# Patient Record
Sex: Female | Born: 1990 | Race: White | Hispanic: No | Marital: Single | Smoking: Never smoker
Health system: Southern US, Community
[De-identification: ages and names within clinical notes are randomized; demographics above are authoritative.]

## PROBLEM LIST (undated history)

## (undated) DIAGNOSIS — F32A Depression, unspecified: Secondary | ICD-10-CM

## (undated) DIAGNOSIS — F329 Major depressive disorder, single episode, unspecified: Secondary | ICD-10-CM

## (undated) DIAGNOSIS — F419 Anxiety disorder, unspecified: Secondary | ICD-10-CM

## (undated) DIAGNOSIS — F429 Obsessive-compulsive disorder, unspecified: Secondary | ICD-10-CM

---

## 2015-07-12 ENCOUNTER — Observation Stay (HOSPITAL_COMMUNITY)
Admission: EM | Admit: 2015-07-12 | Discharge: 2015-07-15 | Disposition: A | Discharge status: Home / Self Care | Payer: Self-pay | Attending: Internal Medicine | Admitting: Internal Medicine

## 2015-07-12 ENCOUNTER — Emergency Department (HOSPITAL_COMMUNITY): Payer: Self-pay

## 2015-07-12 ENCOUNTER — Encounter (HOSPITAL_COMMUNITY): Payer: Self-pay

## 2015-07-12 DIAGNOSIS — R740 Nonspecific elevation of levels of transaminase and lactic acid dehydrogenase [LDH]: Secondary | ICD-10-CM

## 2015-07-12 DIAGNOSIS — F419 Anxiety disorder, unspecified: Secondary | ICD-10-CM | POA: Insufficient documentation

## 2015-07-12 DIAGNOSIS — K819 Cholecystitis, unspecified: Secondary | ICD-10-CM | POA: Diagnosis present

## 2015-07-12 DIAGNOSIS — Z79899 Other long term (current) drug therapy: Secondary | ICD-10-CM | POA: Insufficient documentation

## 2015-07-12 DIAGNOSIS — E669 Obesity, unspecified: Secondary | ICD-10-CM | POA: Insufficient documentation

## 2015-07-12 DIAGNOSIS — K831 Obstruction of bile duct: Secondary | ICD-10-CM | POA: Diagnosis present

## 2015-07-12 DIAGNOSIS — K8010 Calculus of gallbladder with chronic cholecystitis without obstruction: Principal | ICD-10-CM | POA: Diagnosis present

## 2015-07-12 DIAGNOSIS — Z419 Encounter for procedure for purposes other than remedying health state, unspecified: Secondary | ICD-10-CM

## 2015-07-12 DIAGNOSIS — K8050 Calculus of bile duct without cholangitis or cholecystitis without obstruction: Secondary | ICD-10-CM | POA: Diagnosis present

## 2015-07-12 DIAGNOSIS — K8020 Calculus of gallbladder without cholecystitis without obstruction: Secondary | ICD-10-CM

## 2015-07-12 DIAGNOSIS — R7401 Elevation of levels of liver transaminase levels: Secondary | ICD-10-CM

## 2015-07-12 DIAGNOSIS — Z6832 Body mass index (BMI) 32.0-32.9, adult: Secondary | ICD-10-CM | POA: Insufficient documentation

## 2015-07-12 HISTORY — DX: Anxiety disorder, unspecified: F41.9

## 2015-07-12 HISTORY — DX: Depression, unspecified: F32.A

## 2015-07-12 HISTORY — DX: Major depressive disorder, single episode, unspecified: F32.9

## 2015-07-12 HISTORY — DX: Obsessive-compulsive disorder, unspecified: F42.9

## 2015-07-12 LAB — URINALYSIS, ROUTINE W REFLEX MICROSCOPIC
Glucose, UA: NEGATIVE mg/dL
Hgb urine dipstick: NEGATIVE
NITRITE: NEGATIVE
PH: 5.5 (ref 5.0–8.0)
PROTEIN: NEGATIVE mg/dL
Specific Gravity, Urine: 1.012 (ref 1.005–1.030)
Urobilinogen, UA: 0.2 mg/dL (ref 0.0–1.0)

## 2015-07-12 LAB — COMPREHENSIVE METABOLIC PANEL
ALT: 273 U/L — ABNORMAL HIGH (ref 14–54)
AST: 158 U/L — ABNORMAL HIGH (ref 15–41)
Albumin: 3.2 g/dL — ABNORMAL LOW (ref 3.5–5.0)
Alkaline Phosphatase: 292 U/L — ABNORMAL HIGH (ref 38–126)
Anion gap: 11 (ref 5–15)
BUN: <5 mg/dL — ABNORMAL LOW (ref 6–20)
CHLORIDE: 111 mmol/L (ref 101–111)
CO2: 12 mmol/L — ABNORMAL LOW (ref 22–32)
Calcium: 8.1 mg/dL — ABNORMAL LOW (ref 8.9–10.3)
Glucose, Bld: 74 mg/dL (ref 65–99)
POTASSIUM: 4.3 mmol/L (ref 3.5–5.1)
Sodium: 134 mmol/L — ABNORMAL LOW (ref 135–145)
Total Bilirubin: 5.3 mg/dL — ABNORMAL HIGH (ref 0.3–1.2)
Total Protein: 5.9 g/dL — ABNORMAL LOW (ref 6.5–8.1)

## 2015-07-12 LAB — LIPASE, BLOOD: LIPASE: 19 U/L — AB (ref 22–51)

## 2015-07-12 LAB — CBC
HCT: 35.3 % — ABNORMAL LOW (ref 36.0–46.0)
Hemoglobin: 11.8 g/dL — ABNORMAL LOW (ref 12.0–15.0)
MCH: 30.3 pg (ref 26.0–34.0)
MCHC: 33.4 g/dL (ref 30.0–36.0)
MCV: 90.5 fL (ref 78.0–100.0)
PLATELETS: 307 10*3/uL (ref 150–400)
RBC: 3.9 MIL/uL (ref 3.87–5.11)
RDW: 13.2 % (ref 11.5–15.5)
WBC: 10 10*3/uL (ref 4.0–10.5)

## 2015-07-12 LAB — I-STAT BETA HCG BLOOD, ED (MC, WL, AP ONLY): I-stat hCG, quantitative: <5 m[IU]/mL (ref <5)

## 2015-07-12 LAB — URINE MICROSCOPIC-ADD ON

## 2015-07-12 MED ORDER — SODIUM CHLORIDE 0.9 % IV BOLUS (SEPSIS)
1000.0000 mL | Freq: Once | INTRAVENOUS | Status: AC
  Administered 2015-07-12: 1000 mL via INTRAVENOUS

## 2015-07-12 MED ORDER — ONDANSETRON HCL 4 MG/2ML IJ SOLN
4.0000 mg | Freq: Once | INTRAMUSCULAR | Status: AC
  Administered 2015-07-12: 4 mg via INTRAVENOUS
  Filled 2015-07-12: qty 2

## 2015-07-12 MED ORDER — ONDANSETRON HCL 4 MG/2ML IJ SOLN
4.0000 mg | Freq: Once | INTRAMUSCULAR | Status: AC
Start: 2015-07-12 — End: 2015-07-12
  Administered 2015-07-12: 4 mg via INTRAVENOUS
  Filled 2015-07-12: qty 2

## 2015-07-12 MED ORDER — KETOROLAC TROMETHAMINE 30 MG/ML IJ SOLN
30.0000 mg | Freq: Once | INTRAMUSCULAR | Status: AC
Start: 2015-07-12 — End: 2015-07-12
  Administered 2015-07-12: 30 mg via INTRAVENOUS
  Filled 2015-07-12: qty 1

## 2015-07-12 MED ORDER — MORPHINE SULFATE (PF) 4 MG/ML IV SOLN
4.0000 mg | Freq: Once | INTRAVENOUS | Status: AC
  Administered 2015-07-12: 4 mg via INTRAVENOUS
  Filled 2015-07-12: qty 1

## 2015-07-12 NOTE — ED Notes (Signed)
I ATTEMPTED TO COLLECT LABS TWICE AND WAS UNSUCCESSFUL.  I MADE THE NURSE AWARE. 

## 2015-07-12 NOTE — ED Notes (Signed)
Ultrasound Tech at bedside.

## 2015-07-12 NOTE — ED Notes (Signed)
Pt c/o bilateral ribcage pain, generalized back pain, and insomnia x 4 days and emesis x 2 days.  Pain score 8/10., increasing w/ deep breathing.  Sts urine had been "very dark yellow."  Denies diarrhea.  Pt reports "I thought it was just an anxiety, but it hasn't gotten better."

## 2015-07-12 NOTE — ED Provider Notes (Signed)
CSN: [id number]     Arrival date & time 07/12/15  1705 History   First MD Initiated Contact with Patient 07/12/15 1731     Chief Complaint  Patient presents with  . Ribcage Pain   . Back Pain  . Emesis     (Consider location/radiation/quality/duration/timing/severity/associated sxs/prior Treatment) HPI  24 year old female who presents with chest wall pain, upper abdominal pain, vomiting, and anxiety. Since Friday, she has had increased anxiety with increased pain in her back, bilateral lower chest wall, and upper abdomen. Pain seems to worsen with eating and movement. Today with multiple episodes of nausea and vomiting.  Urine yesterday appearing dark. Not had symptoms like this before. Denies fevers, chills, diarrhea, melena or hematochezia. No prior abdominal surgeries. No vaginal bleeding or discharge. No sob, cough, congestion, sore throat, runny nose.   Past Medical History  Diagnosis Date  . Anxiety   . OCD (obsessive compulsive disorder)   . Depression    History reviewed. No pertinent past surgical history. History reviewed. No pertinent family history. Social History  Substance Use Topics  . Smoking status: Never Smoker   . Smokeless tobacco: None  . Alcohol Use: Yes     Comment: rarely   OB History    No data available     Review of Systems 10/14 systems reviewed and are negative other than those stated in the HPI    Allergies  Sulfa antibiotics  Home Medications   Prior to Admission medications   Medication Sig Start Date End Date Taking? Authorizing Provider  ibuprofen (ADVIL,MOTRIN) 200 MG tablet Take 400 mg by mouth 2 (two) times daily as needed.   Yes Historical Provider, MD  OVER THE COUNTER MEDICATION Take 15 mLs by mouth daily. Moringa powder, 1 tablespoon mixed in 8oz of liquid   Yes Historical Provider, MD   BP 113/63 mmHg  Pulse 86  Temp(Src) 98.9 F (37.2 C) (Oral)  Resp 29  SpO2 95%  LMP 06/28/2015 Physical Exam Physical Exam  Nursing  note and vitals reviewed. Constitutional: Well developed, well nourished, non-toxic, and in no acute distress Head: Normocephalic and atraumatic.  Mouth/Throat: Oropharynx is clear and moist.  Neck: Normal range of motion. Neck supple.  Cardiovascular: Normal rate and regular rhythm.  No edema. Pulmonary/Chest: Effort normal and breath sounds normal.  Abdominal: Soft. Non-distended. Upper abdominal tenderness worst in epigastrium and RUQ. There is no rebound and no guarding. No significant CVA tenderness. Musculoskeletal: Normal range of motion.  Neurological: Alert, no facial droop, fluent speech, moves all extremities symmetrically Skin: Skin is warm and dry.  Psychiatric: Cooperative  ED Course  Procedures (including critical care time) Labs Review Labs Reviewed  LIPASE, BLOOD - Abnormal; Notable for the following:    Lipase 19 (*)    All other components within normal limits  COMPREHENSIVE METABOLIC PANEL - Abnormal; Notable for the following:    Sodium 134 (*)    CO2 12 (*)    BUN <5 (*)    Creatinine, Ser <0.30 (*)    Calcium 8.1 (*)    Total Protein 5.9 (*)    Albumin 3.2 (*)    AST 158 (*)    ALT 273 (*)    Alkaline Phosphatase 292 (*)    Total Bilirubin 5.3 (*)    All other components within normal limits  CBC - Abnormal; Notable for the following:    Hemoglobin 11.8 (*)    HCT 35.3 (*)    All other components within  normal limits  URINALYSIS, ROUTINE W REFLEX MICROSCOPIC (NOT AT [facility]) - Abnormal; Notable for the following:    Color, Urine AMBER (*)    APPearance CLOUDY (*)    Bilirubin Urine MODERATE (*)    Ketones, ur >80 (*)    Leukocytes, UA SMALL (*)    All other components within normal limits  URINE MICROSCOPIC-ADD ON - Abnormal; Notable for the following:    Squamous Epithelial / LPF FEW (*)    Bacteria, UA MANY (*)    All other components within normal limits  URINE CULTURE  I-STAT BETA HCG BLOOD, ED ([facility], [facility], [facility] ONLY)    Imaging Review Dg Chest 2  View  07/12/2015   CLINICAL DATA:  Chest wall pain  EXAM: CHEST  2 VIEW  COMPARISON:  None.  FINDINGS: The heart size and mediastinal contours are within normal limits. Both lungs are clear. The visualized skeletal structures are unremarkable.  IMPRESSION: No active cardiopulmonary disease.   Electronically Signed   By: Natasha Mead M.D.   On: 07/12/2015 18:49   US Abdomen Limited Ruq  07/13/2015   CLINICAL DATA:  24 year old female with right upper quadrant abdominal pain, vomiting for 2 days and transaminitis.  EXAM: US ABDOMEN LIMITED - RIGHT UPPER QUADRANT  COMPARISON:  None.  FINDINGS: Gallbladder:  Multiple mobile gallstones are identified, measuring up to 8 mm. Mild gallbladder wall thickening is present. There is no evidence of pericholecystic fluid or sonographic Murphy's sign.  Common bile duct:  Diameter: 9 mm.  Intrahepatic dilatation is identified.  Liver:  No focal lesions identified.  There is no evidence of free fluid within the right upper abdomen.  IMPRESSION: Cholelithiasis and mild gallbladder wall thickening which may represent acute cholecystitis.  Intrahepatic and extrahepatic biliary dilatation - likely distal obstructing cause/choledocholithiasis.   Electronically Signed   By: Harmon Pier M.D.   On: 07/13/2015 00:04   I have personally reviewed and evaluated these images and lab results as part of my medical decision-making.   EKG Interpretation   Date/Time:  Tuesday July 12 2015 18:36:05 EDT Ventricular Rate:  65 PR Interval:  138 QRS Duration: 89 QT Interval:  396 QTC Calculation: 412 R Axis:   59 Text Interpretation:  Sinus rhythm No abnormalities. No prior for  comparison Confirmed by Chimere Klingensmith MD, Shevaun Lovan [phone]) on 07/12/2015 6:58:06 PM      MDM   Final diagnoses:  Transaminitis  Biliary obstruction  Choledocholithiasis    Non-toxic and in no acute distress on arrival. VS stable. No evidence of sepsis. Abdomen soft, with epigastric and RUQ tenderness to  palpation. No CVA tenderness. Remainder of exam unremarkable. Blood work concerning for biliary obstruction. Transaminitis with elevated alkaline phosphatase and hyperbilirubinemia. RUQ revealling cholelithiasis and gall bladder wall thickening with evidence of ductal dilation. May be suggestive of early cholecystitis as well given ongoing symptoms since 4 days ago with wall thickening. Discussed with general surgery, who requests CT to rule out mass. Results of CT and final disposition from surgery signed out to oncoming physician. Will require admission with likely surgery and GI following.   Lavera Guise, MD 07/13/15 1136

## 2015-07-12 NOTE — ED Notes (Signed)
IV team, myself and phlebotomy unable to obtain blood, main lab called to obtain blood draw, EDP made aware of same.

## 2015-07-12 NOTE — ED Notes (Signed)
Pt ambulated to restroom without assistance, steady gait.

## 2015-07-12 NOTE — Progress Notes (Signed)
EDCM spoke to patient at bedside. Patient confirms she does not have a pcp or insurance living in [place].  EDCM provided patient with contact information to [facility], informed patient of services there.  EDCM also provided patient with list of pcps who accept self pay patients, list of discount pharmacies and websites needymeds.org and GoodRX.com for medication assistance, phone number to inquire about the orange card, phone number to inquire about Mediciad, phone number to inquire about the Affordable Care Act, financial resources in the community such as local churches, [employer], urban ministries, and dental assistance for uninsured patients.  Patient thankful  for resources.  No further EDCM needs at this time. 

## 2015-07-12 NOTE — ED Notes (Signed)
Main lab unable to obtain blood draw. EDP, Joni Fears made aware of same. Verbal orders for RT to obtain blood draw from arterial stick. RT paged.

## 2015-07-12 NOTE — ED Notes (Signed)
Two unsuccessful IV attempts by this writer.  

## 2015-07-13 ENCOUNTER — Encounter (HOSPITAL_COMMUNITY): Payer: Self-pay | Admitting: Radiology

## 2015-07-13 ENCOUNTER — Encounter (HOSPITAL_COMMUNITY)
Admission: EM | Disposition: A | Discharge status: Home / Self Care | Payer: Self-pay | Source: Home / Self Care | Attending: Emergency Medicine

## 2015-07-13 ENCOUNTER — Inpatient Hospital Stay (HOSPITAL_COMMUNITY): Payer: Self-pay

## 2015-07-13 ENCOUNTER — Emergency Department (HOSPITAL_COMMUNITY): Payer: Self-pay

## 2015-07-13 ENCOUNTER — Inpatient Hospital Stay (HOSPITAL_COMMUNITY): Payer: Self-pay | Admitting: Anesthesiology

## 2015-07-13 ENCOUNTER — Inpatient Hospital Stay (HOSPITAL_COMMUNITY): Payer: MEDICAID | Admitting: Anesthesiology

## 2015-07-13 DIAGNOSIS — K819 Cholecystitis, unspecified: Secondary | ICD-10-CM | POA: Diagnosis present

## 2015-07-13 DIAGNOSIS — K831 Obstruction of bile duct: Secondary | ICD-10-CM | POA: Diagnosis present

## 2015-07-13 DIAGNOSIS — F411 Generalized anxiety disorder: Secondary | ICD-10-CM

## 2015-07-13 DIAGNOSIS — K8050 Calculus of bile duct without cholangitis or cholecystitis without obstruction: Secondary | ICD-10-CM | POA: Diagnosis present

## 2015-07-13 HISTORY — PX: ERCP: SHX5425

## 2015-07-13 LAB — CBC WITH DIFFERENTIAL/PLATELET
Basophils Absolute: 0 10*3/uL (ref 0.0–0.1)
Basophils Relative: 0 %
EOS PCT: 1 %
Eosinophils Absolute: 0.1 10*3/uL (ref 0.0–0.7)
HCT: 38.3 % (ref 36.0–46.0)
Hemoglobin: 12.7 g/dL (ref 12.0–15.0)
LYMPHS ABS: 2.4 10*3/uL (ref 0.7–4.0)
LYMPHS PCT: 31 %
MCH: 30.2 pg (ref 26.0–34.0)
MCHC: 33.2 g/dL (ref 30.0–36.0)
MCV: 91.2 fL (ref 78.0–100.0)
MONO ABS: 0.6 10*3/uL (ref 0.1–1.0)
Monocytes Relative: 7 %
Neutro Abs: 4.8 10*3/uL (ref 1.7–7.7)
Neutrophils Relative %: 61 %
PLATELETS: 310 10*3/uL (ref 150–400)
RBC: 4.2 MIL/uL (ref 3.87–5.11)
RDW: 13.4 % (ref 11.5–15.5)
WBC: 7.9 10*3/uL (ref 4.0–10.5)

## 2015-07-13 LAB — COMPREHENSIVE METABOLIC PANEL
ALBUMIN: 3.7 g/dL (ref 3.5–5.0)
ALK PHOS: 358 U/L — AB (ref 38–126)
ALT: 306 U/L — ABNORMAL HIGH (ref 14–54)
ANION GAP: 14 (ref 5–15)
AST: 177 U/L — AB (ref 15–41)
BILIRUBIN TOTAL: 6.5 mg/dL — AB (ref 0.3–1.2)
BUN: 6 mg/dL (ref 6–20)
CHLORIDE: 107 mmol/L (ref 101–111)
CO2: 15 mmol/L — AB (ref 22–32)
Calcium: 8.8 mg/dL — ABNORMAL LOW (ref 8.9–10.3)
Creatinine, Ser: 0.38 mg/dL — ABNORMAL LOW (ref 0.44–1.00)
GFR calc non Af Amer: >60 mL/min (ref >60)
GLUCOSE: 80 mg/dL (ref 65–99)
POTASSIUM: 3.2 mmol/L — AB (ref 3.5–5.1)
Sodium: 136 mmol/L (ref 135–145)
Total Protein: 6.9 g/dL (ref 6.5–8.1)

## 2015-07-13 LAB — PROTIME-INR
INR: 1.04 (ref 0.00–1.49)
Prothrombin Time: 13.8 seconds (ref 11.6–15.2)

## 2015-07-13 SURGERY — ERCP, WITH INTERVENTION IF INDICATED
Anesthesia: General

## 2015-07-13 MED ORDER — ALPRAZOLAM 0.25 MG PO TABS
0.2500 mg | ORAL_TABLET | Freq: Three times a day (TID) | ORAL | Status: DC | PRN
  Administered 2015-07-14: 0.25 mg via ORAL
  Filled 2015-07-13: qty 1

## 2015-07-13 MED ORDER — DEXTROSE 5 % IV SOLN
2.0000 g | INTRAVENOUS | Status: DC
  Administered 2015-07-14: 2 g via INTRAVENOUS
  Filled 2015-07-13: qty 2

## 2015-07-13 MED ORDER — FENTANYL CITRATE (PF) 250 MCG/5ML IJ SOLN
INTRAMUSCULAR | Status: DC | PRN
  Administered 2015-07-13 (×4): 50 ug via INTRAVENOUS

## 2015-07-13 MED ORDER — SODIUM CHLORIDE 0.9 % IJ SOLN
3.0000 mL | Freq: Two times a day (BID) | INTRAMUSCULAR | Status: DC
  Administered 2015-07-13 (×2): 3 mL via INTRAVENOUS

## 2015-07-13 MED ORDER — INDOMETHACIN 50 MG RE SUPP
100.0000 mg | Freq: Once | RECTAL | Status: AC
  Administered 2015-07-13: 100 mg via RECTAL

## 2015-07-13 MED ORDER — GLUCAGON HCL RDNA (DIAGNOSTIC) 1 MG IJ SOLR
INTRAMUSCULAR | Status: AC
  Filled 2015-07-13: qty 1

## 2015-07-13 MED ORDER — INDOMETHACIN 50 MG RE SUPP
RECTAL | Status: AC
  Filled 2015-07-13: qty 2

## 2015-07-13 MED ORDER — ONDANSETRON HCL 4 MG/2ML IJ SOLN: 4.0000 mg | Freq: Four times a day (QID) | INTRAMUSCULAR | Status: DC | PRN

## 2015-07-13 MED ORDER — DEXTROSE 5 % IV SOLN
2.0000 g | Freq: Once | INTRAVENOUS | Status: AC
  Administered 2015-07-13: 2 g via INTRAVENOUS
  Filled 2015-07-13: qty 2

## 2015-07-13 MED ORDER — PROPOFOL 10 MG/ML IV BOLUS
INTRAVENOUS | Status: AC
  Filled 2015-07-13: qty 20

## 2015-07-13 MED ORDER — FENTANYL CITRATE (PF) 100 MCG/2ML IJ SOLN
INTRAMUSCULAR | Status: AC
  Filled 2015-07-13: qty 4

## 2015-07-13 MED ORDER — OXYCODONE HCL 5 MG PO TABS
5.0000 mg | ORAL_TABLET | ORAL | Status: DC | PRN
  Administered 2015-07-15: 5 mg via ORAL
  Filled 2015-07-13: qty 1

## 2015-07-13 MED ORDER — LIDOCAINE HCL (CARDIAC) 20 MG/ML IV SOLN
INTRAVENOUS | Status: DC | PRN
  Administered 2015-07-13: 60 mg via INTRAVENOUS

## 2015-07-13 MED ORDER — FAMOTIDINE IN NACL 20-0.9 MG/50ML-% IV SOLN
20.0000 mg | INTRAVENOUS | Status: DC
  Administered 2015-07-13 – 2015-07-15 (×3): 20 mg via INTRAVENOUS
  Filled 2015-07-13 (×3): qty 50

## 2015-07-13 MED ORDER — PROPOFOL 10 MG/ML IV BOLUS
INTRAVENOUS | Status: DC | PRN
  Administered 2015-07-13: 150 mg via INTRAVENOUS

## 2015-07-13 MED ORDER — SODIUM CHLORIDE 0.9 % IV SOLN: INTRAVENOUS | Status: DC

## 2015-07-13 MED ORDER — GLUCAGON HCL RDNA (DIAGNOSTIC) 1 MG IJ SOLR
INTRAMUSCULAR | Status: DC | PRN
Start: 2015-07-13 — End: 2015-07-13
  Administered 2015-07-13: .5 mg via INTRAVENOUS

## 2015-07-13 MED ORDER — IOHEXOL 300 MG/ML  SOLN
100.0000 mL | Freq: Once | INTRAMUSCULAR | Status: AC | PRN
  Administered 2015-07-13: 100 mL via INTRAVENOUS

## 2015-07-13 MED ORDER — POTASSIUM CHLORIDE 10 MEQ/100ML IV SOLN
10.0000 meq | INTRAVENOUS | Status: AC
  Administered 2015-07-13 (×2): 10 meq via INTRAVENOUS
  Filled 2015-07-13 (×2): qty 100

## 2015-07-13 MED ORDER — MORPHINE SULFATE (PF) 2 MG/ML IV SOLN
2.0000 mg | INTRAVENOUS | Status: DC | PRN
  Administered 2015-07-13 – 2015-07-14 (×2): 2 mg via INTRAVENOUS
  Administered 2015-07-14 – 2015-07-15 (×3): 4 mg via INTRAVENOUS
  Filled 2015-07-13: qty 2
  Filled 2015-07-13: qty 1
  Filled 2015-07-13: qty 2
  Filled 2015-07-13: qty 1
  Filled 2015-07-13: qty 2

## 2015-07-13 MED ORDER — IOHEXOL 350 MG/ML SOLN
INTRAVENOUS | Status: DC | PRN
  Administered 2015-07-13: 60 mL

## 2015-07-13 MED ORDER — LIDOCAINE HCL (CARDIAC) 20 MG/ML IV SOLN
INTRAVENOUS | Status: AC
  Filled 2015-07-13: qty 5

## 2015-07-13 MED ORDER — HEPARIN SODIUM (PORCINE) 5000 UNIT/ML IJ SOLN
5000.0000 [IU] | Freq: Three times a day (TID) | INTRAMUSCULAR | Status: DC
  Administered 2015-07-13 (×2): 5000 [IU] via SUBCUTANEOUS
  Filled 2015-07-13 (×2): qty 1

## 2015-07-13 MED ORDER — ONDANSETRON HCL 4 MG/2ML IJ SOLN
INTRAMUSCULAR | Status: DC | PRN
  Administered 2015-07-13: 4 mg via INTRAVENOUS

## 2015-07-13 MED ORDER — ONDANSETRON HCL 4 MG PO TABS: 4.0000 mg | ORAL_TABLET | Freq: Four times a day (QID) | ORAL | Status: DC | PRN

## 2015-07-13 MED ORDER — LACTATED RINGERS IV SOLN
INTRAVENOUS | Status: DC
Start: 2015-07-13 — End: 2015-07-13
  Administered 2015-07-13: 12:00:00 via INTRAVENOUS

## 2015-07-13 MED ORDER — MORPHINE SULFATE (PF) 2 MG/ML IV SOLN
2.0000 mg | INTRAVENOUS | Status: DC | PRN
  Administered 2015-07-13: 2 mg via INTRAVENOUS
  Filled 2015-07-13: qty 1

## 2015-07-13 MED ORDER — SUCCINYLCHOLINE CHLORIDE 20 MG/ML IJ SOLN
INTRAMUSCULAR | Status: DC | PRN
  Administered 2015-07-13: 100 mg via INTRAVENOUS

## 2015-07-13 MED ORDER — ONDANSETRON HCL 4 MG/2ML IJ SOLN
INTRAMUSCULAR | Status: AC
  Filled 2015-07-13: qty 2

## 2015-07-13 MED ORDER — SODIUM CHLORIDE 0.9 % IV SOLN
INTRAVENOUS | Status: DC
  Administered 2015-07-13: 07:00:00 via INTRAVENOUS

## 2015-07-13 NOTE — Progress Notes (Signed)
ANTIBIOTIC CONSULT NOTE - INITIAL  Pharmacy Consult for Ceftriaxone Indication: Intra-abdominal infection  Allergies  Allergen Reactions  . Sulfa Antibiotics Other (See Comments)    Patient doesn't know    Patient Measurements: Height: 5\' 4"  (162.6 cm) Weight: 192 lb 0.3 oz (87.1 kg) IBW/kg (Calculated) : 54.7  Vital Signs: Temp: 98.4 F (36.9 C) (10/12 0548) Temp Source: Oral (10/12 0548) BP: 129/67 mmHg (10/12 0548) Pulse Rate: 69 (10/12 0548) Intake/Output from previous day:   Intake/Output from this shift:    Labs:  Recent Labs  07/12/15 2201  WBC 10.0  HGB 11.8*  PLT 307  CREATININE <0.30*   CrCl cannot be calculated (Patient has no serum creatinine result on file.). No results for input(s): VANCOTROUGH, VANCOPEAK, VANCORANDOM, GENTTROUGH, GENTPEAK, GENTRANDOM, TOBRATROUGH, TOBRAPEAK, TOBRARND, AMIKACINPEAK, AMIKACINTROU, AMIKACIN in the last 72 hours.   Microbiology: No results found for this or any previous visit (from the past 720 hour(s)).  Medical History: Past Medical History  Diagnosis Date  . Anxiety   . OCD (obsessive compulsive disorder)   . Depression     Medications:  Scheduled:  . [START ON 07/14/2015] cefTRIAXone (ROCEPHIN)  IV  2 g Intravenous Q24H  . famotidine (PEPCID) IV  20 mg Intravenous Q24H  . heparin  5,000 Units Subcutaneous 3 times per day  . sodium chloride  3 mL Intravenous Q12H   Infusions:  . sodium chloride     Assessment:  [age] yr female with c/o pain in ribcage and back with vomiting  U/S of abdomen shows possible acute cholecystitis, biliary obstruction/choledocholithiasis  Pharmacy consulted to dose Ceftriaxone for treatment of intra-abdominal infection  Goal of Therapy:  Eradication of infection  Plan:  Ceftriaxone 2gm IV q24h No dosage adjustment anticipated; therefore will sign off at this time.  Please reconsult if a change in clinical status warrants re-evaluation of dosage.  Camiya Vinal, Joselyn GlassmanLeann Trefz,  PharmD 07/13/2015,5:51 AM

## 2015-07-13 NOTE — Interval H&P Note (Signed)
History and Physical Interval Note:  07/13/2015 12:18 PM   History, laboratories, x-rays personally reviewed. Patient personally seen and examined. Patient's aunt at bedside. Agree with consultation note as outlined. Patient symptomatic cholelithiasis and probable choledocholithiasis. Now for ERCP with possible sphincterotomy and stone extraction. I have reviewed the nature of the procedure, as well as the risks (including but not limited to pancreatitis, bleeding, perforation, infection), benefits, and alternatives. I explained the procedure in detail. Multiple questions answered to their satisfaction.  Katherine Morrow  has presented today for surgery, with the diagnosis of CBD stones  The various methods of treatment have been discussed with the patient and family. After consideration of risks, benefits and other options for treatment, the patient has consented to  Procedure(s): ENDOSCOPIC RETROGRADE CHOLANGIOPANCREATOGRAPHY (ERCP) (N/A) as a surgical intervention .  The patient's history has been reviewed, patient examined, no change in status, stable for surgery.  I have reviewed the patient's chart and labs.  Questions were answered to the patient's satisfaction.     [name]

## 2015-07-13 NOTE — H&P (View-Only) (Signed)
Referring Provider: Dr. Derrell Lolling Primary Care Physician:  No primary care provider on file. Primary Gastroenterologist:  None, unassigned  Reason for Consultation:  Biliary obstruction  HPI: Katherine Morrow is a 24 y.o. female with past medical history of anxiety.  Patient presented to [facility] hospital complaints of right-sided pain.  This is been ongoing for last 1 week and occurring on and off, but became much worse early Saturday morning around 12 am.  Initially the patient and the family though it was an anxiety attack and they were treating it symptomatically as they have done with her presumed anxiety attacks in the past.  Since the pain was not improving the patient was seen by one of her therapist at [facility] and they felt that this pain was out of proportion for any anxiety attack and therefore they sent the patient to the ER.  Was found to elevated LFT's with total bili 5.3 now up to 6.5, AST 158 now up to 177, ALP 273 now up to 306, and ALP of 292 now up to 358.  Ultrasound shows gallstones, along with intra and extrahepatic biliary dilitation with likely distal obstructing cause.  CT scan showed biliary obstruction with intra and extra hepatic biliary ductal dilatation as well with CBD tapering to duodenum but no distinct filling defect.  She also complains of some nausea and vomiting (no vomiting since some time yesterday).  Some chills.  She is already on antibiotics and has already been seen by surgery who requested this consult.  Past Medical History  Diagnosis Date  . Anxiety   . OCD (obsessive compulsive disorder)   . Depression     History reviewed. No pertinent past surgical history.  Prior to Admission medications   Medication Sig Start Date End Date Taking? Authorizing Provider  ibuprofen (ADVIL,MOTRIN) 200 MG tablet Take 400 mg by mouth 2 (two) times daily as needed.   Yes Historical Provider, MD  OVER THE COUNTER MEDICATION Take 15 mLs by mouth daily. Moringa powder, 1  tablespoon mixed in 8oz of liquid   Yes Historical Provider, MD    Current Facility-Administered Medications  Medication Dose Route Frequency Provider Last Rate Last Dose  . 0.9 %  sodium chloride infusion   Intravenous Continuous Rolly Salter, MD 100 mL/hr at 07/13/15 0644    . ALPRAZolam (XANAX) tablet 0.25 mg  0.25 mg Oral TID PRN Rolly Salter, MD      . Melene Muller ON 07/14/2015] cefTRIAXone (ROCEPHIN) 2 g in dextrose 5 % 50 mL IVPB  2 g Intravenous Q24H Leann T Poindexter, RPH      . famotidine (PEPCID) IVPB 20 mg premix  20 mg Intravenous Q24H Rolly Salter, MD   20 mg at 07/13/15 0645  . heparin injection 5,000 Units  5,000 Units Subcutaneous 3 times per day Rolly Salter, MD   5,000 Units at 07/13/15 [phone]  . morphine 2 MG/ML injection 2-4 mg  2-4 mg Intravenous Q3H PRN Rolly Salter, MD   2 mg at 07/13/15 0702  . ondansetron (ZOFRAN) tablet 4 mg  4 mg Oral Q6H PRN Rolly Salter, MD       Or  . ondansetron [facility]) injection 4 mg  4 mg Intravenous Q6H PRN Rolly Salter, MD      . oxyCODONE (Oxy IR/ROXICODONE) immediate release tablet 5 mg  5 mg Oral Q4H PRN Rolly Salter, MD      . sodium chloride 0.9 % injection 3 mL  3  mL Intravenous Q12H Pranav M Patel, MD        Allergies as of 07/12/2015 - Review Complete 07/12/2015  Allergen Reaction Noted  . Sulfa antibiotics Other (See Comments) 07/12/2015    History reviewed. No pertinent family history.  Social History   Social History  . Marital Status: Single    Spouse Name: N/A  . Number of Children: N/A  . Years of Education: N/A   Occupational History  . Not on file.   Social History Main Topics  . Smoking status: Never Smoker   . Smokeless tobacco: Not on file  . Alcohol Use: Yes     Comment: rarely  . Drug Use: Yes    Special: Marijuana  . Sexual Activity: Not on file   Other Topics Concern  . Not on file   Social History Narrative    Review of Systems: Ten point ROS is O/W negative except as mentioned  in HPI.  Physical Exam: Vital signs in last 24 hours: Temp:  [98.4 F (36.9 C)-98.9 F (37.2 C)] 98.4 F (36.9 C) (10/12 0548) Pulse Rate:  [69-100] 69 (10/12 0548) Resp:  [14-29] 18 (10/12 0548) BP: (106-143)/(57-83) 129/67 mmHg (10/12 0548) SpO2:  [94 %-100 %] 100 % (10/12 0548) Weight:  [192 lb 0.3 oz (87.1 kg)] 192 lb 0.3 oz (87.1 kg) (10/12 0548) Last BM Date: 07/11/15 General:  Alert, Well-developed, well-nourished, pleasant and cooperative in NAD Head:  Normocephalic and atraumatic. Eyes:  Scleral icterus noted. Ears:  Normal auditory acuity. Mouth:  No deformity or lesions.   Lungs:  Clear throughout to auscultation.   No wheezes, crackles, or rhonchi.  Heart:  Regular rate and rhythm; no murmurs, clicks, rubs,  or gallops. Abdomen:  Soft, non-distended.  Actual very minimal TTP on exam.  BS present. Rectal:  Deferred  Msk:  Symmetrical without gross deformities. Pulses:  Normal pulses noted. Extremities:  Without clubbing or edema. Neurologic:  Alert and  oriented x4;  grossly normal neurologically. Skin:  Intact without significant lesions or rashes. Psych:  Alert and cooperative. Normal mood and affect.  Lab Results:  Recent Labs  07/12/15 2201 07/13/15 0720  WBC 10.0 7.9  HGB 11.8* 12.7  HCT 35.3* 38.3  PLT 307 310   BMET  Recent Labs  07/12/15 2201 07/13/15 0720  NA 134* 136  K 4.3 3.2*  CL 111 107  CO2 12* 15*  GLUCOSE 74 80  BUN <5* 6  CREATININE <0.30* 0.38*  CALCIUM 8.1* 8.8*   LFT  Recent Labs  07/13/15 0720  PROT 6.9  ALBUMIN 3.7  AST 177*  ALT 306*  ALKPHOS 358*  BILITOT 6.5*   PT/INR  Recent Labs  07/13/15 0720  LABPROT 13.8  INR 1.04   Studies/Results: Dg Chest 2 View  07/12/2015  CLINICAL DATA:  Chest wall pain EXAM: CHEST  2 VIEW COMPARISON:  None. FINDINGS: The heart size and mediastinal contours are within normal limits. Both lungs are clear. The visualized skeletal structures are unremarkable. IMPRESSION: No  active cardiopulmonary disease. Electronically Signed   By: Liviu  Pop M.D.   On: 07/12/2015 18:49   Us Abdomen Limited Ruq  07/13/2015  CLINICAL DATA:  24-year-old female with right upper quadrant abdominal pain, vomiting for 2 days and transaminitis. EXAM: US ABDOMEN LIMITED - RIGHT UPPER QUADRANT COMPARISON:  None. FINDINGS: Gallbladder: Multiple mobile gallstones are identified, measuring up to 8 mm. Mild gallbladder wall thickening is present. There is no evidence of pericholecystic fluid or sonographic Murphy's   sign. Common bile duct: Diameter: 9 mm.  Intrahepatic dilatation is identified. Liver: No focal lesions identified. There is no evidence of free fluid within the right upper abdomen. IMPRESSION: Cholelithiasis and mild gallbladder wall thickening which may represent acute cholecystitis. Intrahepatic and extrahepatic biliary dilatation - likely distal obstructing cause/choledocholithiasis. Electronically Signed   By: Harmon PierJeffrey  Hu M.D.   On: 07/13/2015 00:04    IMPRESSION:  -Elevated LFT's with abdominal pain and biliary dilitation.  Also gallstones on imaging. Suspect choledocholithiasis. -Hypokalemia:  Being replaced by primary service.  PLAN: -Will plan for ERCP by Dr. Marina GoodellPerry today.  She is already on antibiotics.  Post-procedure indomethacin ordered.  Cholecystectomy per surgery after ERCP.  Rosey Eide D.  07/13/2015, 8:55 AM  Pager number [phone]

## 2015-07-13 NOTE — Consult Note (Signed)
Referring Provider: Dr. Derrell Lolling Primary Care Physician:  No primary care provider on file. Primary Gastroenterologist:  None, unassigned  Reason for Consultation:  Biliary obstruction  HPI: Katherine Morrow is a 24 y.o. female with past medical history of anxiety.  Patient presented to [facility] hospital complaints of right-sided pain.  This is been ongoing for last 1 week and occurring on and off, but became much worse early Saturday morning around 12 am.  Initially the patient and the family though it was an anxiety attack and they were treating it symptomatically as they have done with her presumed anxiety attacks in the past.  Since the pain was not improving the patient was seen by one of her therapist at [facility] and they felt that this pain was out of proportion for any anxiety attack and therefore they sent the patient to the ER.  Was found to elevated LFT's with total bili 5.3 now up to 6.5, AST 158 now up to 177, ALP 273 now up to 306, and ALP of 292 now up to 358.  Ultrasound shows gallstones, along with intra and extrahepatic biliary dilitation with likely distal obstructing cause.  CT scan showed biliary obstruction with intra and extra hepatic biliary ductal dilatation as well with CBD tapering to duodenum but no distinct filling defect.  She also complains of some nausea and vomiting (no vomiting since some time yesterday).  Some chills.  She is already on antibiotics and has already been seen by surgery who requested this consult.  Past Medical History  Diagnosis Date  . Anxiety   . OCD (obsessive compulsive disorder)   . Depression     History reviewed. No pertinent past surgical history.  Prior to Admission medications   Medication Sig Start Date End Date Taking? Authorizing Provider  ibuprofen (ADVIL,MOTRIN) 200 MG tablet Take 400 mg by mouth 2 (two) times daily as needed.   Yes Historical Provider, MD  OVER THE COUNTER MEDICATION Take 15 mLs by mouth daily. Moringa powder, 1  tablespoon mixed in 8oz of liquid   Yes Historical Provider, MD    Current Facility-Administered Medications  Medication Dose Route Frequency Provider Last Rate Last Dose  . 0.9 %  sodium chloride infusion   Intravenous Continuous Rolly Salter, MD 100 mL/hr at 07/13/15 0644    . ALPRAZolam (XANAX) tablet 0.25 mg  0.25 mg Oral TID PRN Rolly Salter, MD      . Melene Muller ON 07/14/2015] cefTRIAXone (ROCEPHIN) 2 g in dextrose 5 % 50 mL IVPB  2 g Intravenous Q24H Leann T Poindexter, RPH      . famotidine (PEPCID) IVPB 20 mg premix  20 mg Intravenous Q24H Rolly Salter, MD   20 mg at 07/13/15 0645  . heparin injection 5,000 Units  5,000 Units Subcutaneous 3 times per day Rolly Salter, MD   5,000 Units at 07/13/15 [phone]  . morphine 2 MG/ML injection 2-4 mg  2-4 mg Intravenous Q3H PRN Rolly Salter, MD   2 mg at 07/13/15 0702  . ondansetron (ZOFRAN) tablet 4 mg  4 mg Oral Q6H PRN Rolly Salter, MD       Or  . ondansetron [facility]) injection 4 mg  4 mg Intravenous Q6H PRN Rolly Salter, MD      . oxyCODONE (Oxy IR/ROXICODONE) immediate release tablet 5 mg  5 mg Oral Q4H PRN Rolly Salter, MD      . sodium chloride 0.9 % injection 3 mL  3  mL Intravenous Q12H Rolly Salter, MD        Allergies as of 07/12/2015 - Review Complete 07/12/2015  Allergen Reaction Noted  . Sulfa antibiotics Other (See Comments) 07/12/2015    History reviewed. No pertinent family history.  Social History   Social History  . Marital Status: Single    Spouse Name: N/A  . Number of Children: N/A  . Years of Education: N/A   Occupational History  . Not on file.   Social History Main Topics  . Smoking status: Never Smoker   . Smokeless tobacco: Not on file  . Alcohol Use: Yes     Comment: rarely  . Drug Use: Yes    Special: Marijuana  . Sexual Activity: Not on file   Other Topics Concern  . Not on file   Social History Narrative    Review of Systems: Ten point ROS is O/W negative except as mentioned  in HPI.  Physical Exam: Vital signs in last 24 hours: Temp:  [98.4 F (36.9 C)-98.9 F (37.2 C)] 98.4 F (36.9 C) (10/12 0548) Pulse Rate:  [69-100] 69 (10/12 0548) Resp:  [14-29] 18 (10/12 0548) BP: (106-143)/(57-83) 129/67 mmHg (10/12 0548) SpO2:  [94 %-100 %] 100 % (10/12 0548) Weight:  [192 lb 0.3 oz (87.1 kg)] 192 lb 0.3 oz (87.1 kg) (10/12 0548) Last BM Date: 07/11/15 General:  Alert, Well-developed, well-nourished, pleasant and cooperative in NAD Head:  Normocephalic and atraumatic. Eyes:  Scleral icterus noted. Ears:  Normal auditory acuity. Mouth:  No deformity or lesions.   Lungs:  Clear throughout to auscultation.   No wheezes, crackles, or rhonchi.  Heart:  Regular rate and rhythm; no murmurs, clicks, rubs,  or gallops. Abdomen:  Soft, non-distended.  Actual very minimal TTP on exam.  BS present. Rectal:  Deferred  Msk:  Symmetrical without gross deformities. Pulses:  Normal pulses noted. Extremities:  Without clubbing or edema. Neurologic:  Alert and  oriented x4;  grossly normal neurologically. Skin:  Intact without significant lesions or rashes. Psych:  Alert and cooperative. Normal mood and affect.  Lab Results:  Recent Labs  07/12/15 2201 07/13/15 0720  WBC 10.0 7.9  HGB 11.8* 12.7  HCT 35.3* 38.3  PLT 307 310   BMET  Recent Labs  07/12/15 2201 07/13/15 0720  NA 134* 136  K 4.3 3.2*  CL 111 107  CO2 12* 15*  GLUCOSE 74 80  BUN <5* 6  CREATININE <0.30* 0.38*  CALCIUM 8.1* 8.8*   LFT  Recent Labs  07/13/15 0720  PROT 6.9  ALBUMIN 3.7  AST 177*  ALT 306*  ALKPHOS 358*  BILITOT 6.5*   PT/INR  Recent Labs  07/13/15 0720  LABPROT 13.8  INR 1.04   Studies/Results: Dg Chest 2 View  07/12/2015  CLINICAL DATA:  Chest wall pain EXAM: CHEST  2 VIEW COMPARISON:  None. FINDINGS: The heart size and mediastinal contours are within normal limits. Both lungs are clear. The visualized skeletal structures are unremarkable. IMPRESSION: No  active cardiopulmonary disease. Electronically Signed   By: Natasha Mead M.D.   On: 07/12/2015 18:49   US Abdomen Limited Ruq  07/13/2015  CLINICAL DATA:  24 year old female with right upper quadrant abdominal pain, vomiting for 2 days and transaminitis. EXAM: US ABDOMEN LIMITED - RIGHT UPPER QUADRANT COMPARISON:  None. FINDINGS: Gallbladder: Multiple mobile gallstones are identified, measuring up to 8 mm. Mild gallbladder wall thickening is present. There is no evidence of pericholecystic fluid or sonographic Murphy's  sign. Common bile duct: Diameter: 9 mm.  Intrahepatic dilatation is identified. Liver: No focal lesions identified. There is no evidence of free fluid within the right upper abdomen. IMPRESSION: Cholelithiasis and mild gallbladder wall thickening which may represent acute cholecystitis. Intrahepatic and extrahepatic biliary dilatation - likely distal obstructing cause/choledocholithiasis. Electronically Signed   By: Harmon PierJeffrey  Hu M.D.   On: 07/13/2015 00:04    IMPRESSION:  -Elevated LFT's with abdominal pain and biliary dilitation.  Also gallstones on imaging. Suspect choledocholithiasis. -Hypokalemia:  Being replaced by primary service.  PLAN: -Will plan for ERCP by Dr. Marina GoodellPerry today.  She is already on antibiotics.  Post-procedure indomethacin ordered.  Cholecystectomy per surgery after ERCP.  Gervis Gaba D.  07/13/2015, 8:55 AM  Pager number [phone]

## 2015-07-13 NOTE — Op Note (Signed)
[facility] [address] [place], [id number]   ERCP PROCEDURE REPORT        EXAM DATE: 07/13/2015  PATIENT NAME:          Nani GasserWatson, Katherine          MR #:        [id number]  BIRTHDATE:       [birth year]     VISIT #:     [id number] ATTENDING:     Roxy CedarJohn N Perry Jr, MD     STATUS:     inpatient ASSISTANT:      Anthony SarMadden, Daniel and Windell Hummingbirdetteh, Samuel  INDICATIONS:  The patient is a 24 yr old female here for an ERCP due to suspected or rule out bile duct stones.   pain, elevated liver tests, cholelithiasis, dilated bile duct on imaging PROCEDURE PERFORMED:     ERCP with sphincterotomy/papillotomy ERCP with removal of calculus/calculi MEDICATIONS:     Per Anesthesia    . Preoperative antibiotics administered  CONSENT: The patient understands the risks and benefits of the procedure and understands that these risks include, but are not limited to: sedation, allergic reaction, infection, perforation and/or bleeding. Alternative means of evaluation and treatment include, among others: physical exam, x-rays, and/or surgical intervention. The patient elects to proceed with this endoscopic procedure.  DESCRIPTION OF PROCEDURE: During intra-op preparation period all mechanical & medical equipment was checked for proper function. Hand hygiene and appropriate measures for infection prevention was taken. After the risks, benefits and alternatives of the procedure were thoroughly explained, Informed was verified, confirmed and timeout was successfully executed by the treatment team. With the patient in left semi-prone position, medications were administered intravenously.The duodenoscope [id number] was passed from the mouth into the esophagus and further advanced from the esophagus into the stomach. From stomach scope was directed to the second portion of the duodenum. .  The stomach and duodenum were unremarkable.  The major ampulla was normal.  The minor ampulla was not sought.   The Scott radiograph of the abdomen with the endoscope in position revealed CT contrast. The common bile duct was selectively and deeply cannulated with the cannulatome upon the first pass.  No contrast injected.  No manipulation of the pancreatic duct.  Opacification of the common bile duct revealed dilation to 12 mm.  There was an 8 mm persistent filling defect consistent with stone.  THERAPY: A moderate biliary sphincterotomy was made by cutting in the 12:00 orientation over the hydrophilic guidewire using the ERBE system.  Initial attempts to remove the stone with extraction balloon were unsuccessful.  The sphincterotomy was then dilated with a 10 mm Hurricaine balloon. Subsequently stone was easily delivered into the duodenum with extraction balloon.  Post extraction occlusion cholangiogram was negative for residual filling defects with excellent drainage. Minor filling of the cystic duct noted.    ADVERSE EVENT:     There were no complications. IMPRESSIONS:     1. Choledocholithiasis status post ERC with sphincterotomy and stone extraction 2. Cholelithiasis  RECOMMENDATIONS:     1. Standard post ERCP Observation .Marland Kitchen. Indomethacin suppositories given and lactated Ringer's at 200 mL per hour IV. 2. Laparoscopic cholecystectomy per general surgery  Findings and recommendations discussed with the patient's family  REPEAT EXAM:   ___________________________________ Roxy CedarJohn N Perry Jr, MD eSigned:  Roxy CedarJohn N Perry Jr, MD 07/13/2015 1:51 PM   cc:   Dr. Derrell Lollingamirez  [facility]) ; the patient     CPT CODES:  1.  16109 Endoscopic retrograde cholangiopancreatography (ERCP); w/ sphincterotomy/papillotomy 2.  43264 Endoscopic retrograde cholangiopancreatography (ERCP); w/ endoscopic retrograde removal of calculus/calculi from biliary and/or pancreatic ducts ICD9 CODES:  The ICD and CPT codes recommended by this software are interpretations from the data that the  clinical staff has captured with the software.  The verification of the translation of this report to the ICD and CPT codes and modifiers is the sole responsibility of the health care institution and practicing physician where this report was generated.  [employer] will not be held responsible for the validity of the ICD and CPT codes included on this report.  AMA assumes no liability for data contained or not contained herein. CPT is a Publishing rights manager of the [employer].   PATIENT NAME:  Katherine Morrow, Katherine Morrow MR#: [id number]

## 2015-07-13 NOTE — Anesthesia Preprocedure Evaluation (Signed)
Anesthesia Evaluation  Patient identified by MRN, date of birth, ID band Patient awake    Reviewed: Allergy & Precautions, NPO status , Patient's Chart, lab work & pertinent test results  History of Anesthesia Complications Negative for: history of anesthetic complications  Airway Mallampati: I  TM Distance: >3 FB Neck ROM: Full    Dental  (+) Teeth Intact, Dental Advisory Given   Pulmonary neg pulmonary ROS,    Pulmonary exam normal breath sounds clear to auscultation       Cardiovascular Exercise Tolerance: Good (-) hypertensionnegative cardio ROS Normal cardiovascular exam Rhythm:Regular Rate:Normal     Neuro/Psych PSYCHIATRIC DISORDERS Anxiety Depression negative neurological ROS     GI/Hepatic Neg liver ROS, Cholelithiasis   Endo/Other  negative endocrine ROSObesity  Renal/GU negative Renal ROS     Musculoskeletal negative musculoskeletal ROS (+)   Abdominal   Peds  Hematology negative hematology ROS (+)   Anesthesia Other Findings Day of surgery medications reviewed with the patient.  Reproductive/Obstetrics negative OB ROS                             Anesthesia Physical Anesthesia Plan  ASA: II  Anesthesia Plan: General   Post-op Pain Management:    Induction: Intravenous  Airway Management Planned: Oral ETT  Additional Equipment:   Intra-op Plan:   Post-operative Plan: Extubation in OR  Informed Consent: I have reviewed the patients History and Physical, chart, labs and discussed the procedure including the risks, benefits and alternatives for the proposed anesthesia with the patient or authorized representative who has indicated his/her understanding and acceptance.   Dental advisory given  Plan Discussed with: CRNA  Anesthesia Plan Comments: (Risks/benefits of general anesthesia discussed with patient including risk of damage to teeth, lips, gum, and tongue,  nausea/vomiting, allergic reactions to medications, and the possibility of heart attack, stroke and death.  All patient questions answered.  Patient wishes to proceed.)        Anesthesia Quick Evaluation

## 2015-07-13 NOTE — Progress Notes (Signed)
Patient ID: Katherine Morrow, female   DOB: June 14, 1991, 24 y.o.   MRN: [id number]  General Surgery [facility]  Results of ERCP reviewed with patient's family in room.  Will make NPO after MN and check labs in AM.  Will make final decision in the morning regarding proceeding with lap chole tomorrow.  Family in agreement.  Velora Hecklerodd M. Jodean Valade, MD, [facility] Office: [phone]

## 2015-07-13 NOTE — ED Notes (Signed)
Family at bedside. Pt resting 

## 2015-07-13 NOTE — Consult Note (Signed)
Reason for Consult: Possible cholecystitis, choledocholithiasis Referring Physician: Dr. Matthias Hughs Gasser is an 24 y.o. female.  HPI: The patient is a 24 year old female with a 3-day history of epigastric abdominal pain and back pain. Patient states that she had some associated nausea vomiting starting yesterday. Secondary to the pain patient presented to ER for further evaluation.  Patient underwent ultrasound laboratory studies. Ultrasound revealed dilated common bile ducts intrahepatically. LFTs were abnormal with a T bili = 5.3, AST/ALT= 158/273, alkaline phosphatase= 272, lipase= within normal limits.  WBC=10  Surgery was consulted for further evaluation and recommendations.  Past Medical History  Diagnosis Date  . Anxiety   . OCD (obsessive compulsive disorder)   . Depression     History reviewed. No pertinent past surgical history.  History reviewed. No pertinent family history.  Social History:  reports that she has never smoked. She does not have any smokeless tobacco history on file. She reports that she drinks alcohol. She reports that she uses illicit drugs (Marijuana).  Allergies:  Allergies  Allergen Reactions  . Sulfa Antibiotics Other (See Comments)    Patient doesn't know    Medications: I have reviewed the patient's current medications.  Results for orders placed or performed during the hospital encounter of 07/12/15 (from the past 48 hour(s))  Urinalysis, Routine w reflex microscopic (not at [facility])     Status: Abnormal   Collection Time: 07/12/15  6:27 PM  Result Value Ref Range   Color, Urine AMBER (A) YELLOW    Comment: BIOCHEMICALS MAY BE AFFECTED BY COLOR   APPearance CLOUDY (A) CLEAR   Specific Gravity, Urine 1.012 1.005 - 1.030   pH 5.5 5.0 - 8.0   Glucose, UA NEGATIVE NEGATIVE mg/dL   Hgb urine dipstick NEGATIVE NEGATIVE   Bilirubin Urine MODERATE (A) NEGATIVE   Ketones, ur >80 (A) NEGATIVE mg/dL   Protein, ur NEGATIVE NEGATIVE mg/dL    Urobilinogen, UA 0.2 0.0 - 1.0 mg/dL   Nitrite NEGATIVE NEGATIVE   Leukocytes, UA SMALL (A) NEGATIVE  Urine microscopic-add on     Status: Abnormal   Collection Time: 07/12/15  6:27 PM  Result Value Ref Range   Squamous Epithelial / LPF FEW (A) RARE   WBC, UA 7-10 <3 WBC/hpf   RBC / HPF 3-6 <3 RBC/hpf   Bacteria, UA MANY (A) RARE   Urine-Other MUCOUS PRESENT   Lipase, blood     Status: Abnormal   Collection Time: 07/12/15 10:01 PM  Result Value Ref Range   Lipase 19 (L) 22 - 51 U/L  Comprehensive metabolic panel     Status: Abnormal   Collection Time: 07/12/15 10:01 PM  Result Value Ref Range   Sodium 134 (L) 135 - 145 mmol/L   Potassium 4.3 3.5 - 5.1 mmol/L    Comment: SLIGHT HEMOLYSIS   Chloride 111 101 - 111 mmol/L   CO2 12 (L) 22 - 32 mmol/L   Glucose, Bld 74 65 - 99 mg/dL   BUN <5 (L) 6 - 20 mg/dL   Creatinine, Ser <0.30 (L) 0.44 - 1.00 mg/dL   Calcium 8.1 (L) 8.9 - 10.3 mg/dL   Total Protein 5.9 (L) 6.5 - 8.1 g/dL   Albumin 3.2 (L) 3.5 - 5.0 g/dL   AST 158 (H) 15 - 41 U/L   ALT 273 (H) 14 - 54 U/L   Alkaline Phosphatase 292 (H) 38 - 126 U/L   Total Bilirubin 5.3 (H) 0.3 - 1.2 mg/dL   GFR calc non Af  Amer NOT CALCULATED >60 mL/min   GFR calc Af Amer NOT CALCULATED >60 mL/min    Comment: (NOTE) The eGFR has been calculated using the CKD EPI equation. This calculation has not been validated in all clinical situations. eGFR's persistently <60 mL/min signify possible Chronic Kidney Disease.    Anion gap 11 5 - 15  CBC     Status: Abnormal   Collection Time: 07/12/15 10:01 PM  Result Value Ref Range   WBC 10.0 4.0 - 10.5 K/uL   RBC 3.90 3.87 - 5.11 MIL/uL   Hemoglobin 11.8 (L) 12.0 - 15.0 g/dL   HCT 35.3 (L) 36.0 - 46.0 %   MCV 90.5 78.0 - 100.0 fL   MCH 30.3 26.0 - 34.0 pg   MCHC 33.4 30.0 - 36.0 g/dL   RDW 13.2 11.5 - 15.5 %   Platelets 307 150 - 400 K/uL  I-Stat beta hCG blood, ED ([facility], [facility], [facility] only)     Status: None   Collection Time: 07/12/15 10:02 PM   Result Value Ref Range   I-stat hCG, quantitative <5.0 <5 mIU/mL   Comment 3            Comment:   GEST. AGE      CONC.  (mIU/mL)   <=1 WEEK        5 - 50     2 WEEKS       50 - 500     3 WEEKS       100 - 10,000     4 WEEKS     1,000 - 30,000        FEMALE AND NON-PREGNANT FEMALE:     LESS THAN 5 mIU/mL     Dg Chest 2 View  07/12/2015  CLINICAL DATA:  Chest wall pain EXAM: CHEST  2 VIEW COMPARISON:  None. FINDINGS: The heart size and mediastinal contours are within normal limits. Both lungs are clear. The visualized skeletal structures are unremarkable. IMPRESSION: No active cardiopulmonary disease. Electronically Signed   By: Lahoma Crocker M.D.   On: 07/12/2015 18:49   US Abdomen Limited Ruq  07/13/2015  CLINICAL DATA:  24 year old female with right upper quadrant abdominal pain, vomiting for 2 days and transaminitis. EXAM: US ABDOMEN LIMITED - RIGHT UPPER QUADRANT COMPARISON:  None. FINDINGS: Gallbladder: Multiple mobile gallstones are identified, measuring up to 8 mm. Mild gallbladder wall thickening is present. There is no evidence of pericholecystic fluid or sonographic Murphy's sign. Common bile duct: Diameter: 9 mm.  Intrahepatic dilatation is identified. Liver: No focal lesions identified. There is no evidence of free fluid within the right upper abdomen. IMPRESSION: Cholelithiasis and mild gallbladder wall thickening which may represent acute cholecystitis. Intrahepatic and extrahepatic biliary dilatation - likely distal obstructing cause/choledocholithiasis. Electronically Signed   By: Margarette Canada M.D.   On: 07/13/2015 00:04    Review of Systems  Constitutional: Negative for fever and chills.  HENT: Negative.   Eyes:       "yellowing" eyes  Respiratory: Negative.   Cardiovascular: Negative.   Gastrointestinal: Positive for nausea, vomiting and abdominal pain. Negative for diarrhea and constipation.  Genitourinary:       Darkening of urine   Musculoskeletal: Negative.    Neurological: Negative.    Blood pressure 129/67, pulse 69, temperature 98.4 F (36.9 C), temperature source Oral, resp. rate 18, height 5' 4"  (1.626 m), weight 87.1 kg (192 lb 0.3 oz), last menstrual period 06/28/2015, SpO2 100 %. Physical Exam  Constitutional: She  is oriented to person, place, and time. She appears well-developed and well-nourished.  HENT:  Head: Normocephalic and atraumatic.  Eyes: Pupils are equal, round, and reactive to light. Scleral icterus is present.  Neck: Normal range of motion. Neck supple.  Cardiovascular: Normal rate, regular rhythm and normal heart sounds.   Respiratory: Effort normal and breath sounds normal.  GI: Bowel sounds are normal. She exhibits no distension. There is no tenderness (no ttp in RUQ with deep palpation). There is no rebound and no guarding.  Musculoskeletal: Normal range of motion.  Neurological: She is alert and oriented to person, place, and time.  Skin: Skin is warm and dry.  Non jaundiced    Assessment/Plan: 24 y/o F with possible cholecystitis and possible choledocholithiasis 1. Rec GI consult for ERCP 2. I d/w the patient and her aunt that she would likely require lap chole during this hospital stay once CBD cleared and obstruction relieved. 3. Rec empiric abx coverage. 4. Will follow along.  Rosario Jacks., Arren Laminack 07/13/2015, 6:08 AM

## 2015-07-13 NOTE — Anesthesia Postprocedure Evaluation (Signed)
  Anesthesia Post-op Note  Patient: Katherine Morrow  Procedure(s) Performed: Procedure(s) (LRB): ENDOSCOPIC RETROGRADE CHOLANGIOPANCREATOGRAPHY (ERCP) (N/A)  Patient Location: PACU  Anesthesia Type: General  Level of Consciousness: awake and alert   Airway and Oxygen Therapy: Patient Spontanous Breathing  Post-op Pain: mild  Post-op Assessment: Post-op Vital signs reviewed, Patient's Cardiovascular Status Stable, Respiratory Function Stable, Patent Airway and No signs of Nausea or vomiting  Last Vitals:  Filed Vitals:   07/13/15 1345  BP: 140/73  Pulse: 75  Temp: 36.6 C  Resp: 25    Post-op Vital Signs: stable   Complications: No apparent anesthesia complications

## 2015-07-13 NOTE — Progress Notes (Signed)
24 year old lady admitted earlier this am, admitted for right sided abdominal pain, was found to have  Biliary obstruction, with US showing GB stones and acute cholecystitis.  Gi and surgery consulted and she underwent ERCP.  Plan for lap chole in am as per surgery.    Edson SnowballVijaya Caitlyn Buchanan,MD [phone]

## 2015-07-13 NOTE — Progress Notes (Signed)
  Subjective: Still having some nausea and pain, does not seem to be in any distress right now.  Labs just drawn.  No vomiting.  Objective: Vital signs in last 24 hours: Temp:  [98.4 F (36.9 C)-98.9 F (37.2 C)] 98.4 F (36.9 C) (10/12 0548) Pulse Rate:  [69-100] 69 (10/12 0548) Resp:  [14-29] 18 (10/12 0548) BP: (106-143)/(57-83) 129/67 mmHg (10/12 0548) SpO2:  [94 %-100 %] 100 % (10/12 0548) Weight:  [87.1 kg (192 lb 0.3 oz)] 87.1 kg (192 lb 0.3 oz) (10/12 0548) Last BM Date: 07/11/15 NPO Afebrile, VSS Bilibrubin 5.3 last PM ALT158 AST 273 last PM? UTI? Labs pending this AM. Urine culture pending US:  Multiple mobile gallstones are identified, measuring up to 8 mm. Mild gallbladder wall thickening is present. There is no evidence of pericholecystic fluid or sonographic Murphy's sign.  CBD 9 mm intrahepatic dilatation noted. CT pending Intake/Output from previous day:   Intake/Output this shift:    General appearance: alert, cooperative and no distress GI: soft, few BS  No tenderness.    Lab Results:   Recent Labs  07/12/15 2201  WBC 10.0  HGB 11.8*  HCT 35.3*  PLT 307    BMET  Recent Labs  07/12/15 2201  NA 134*  K 4.3  CL 111  CO2 12*  GLUCOSE 74  BUN <5*  CREATININE <0.30*  CALCIUM 8.1*   PT/INR No results for input(s): LABPROT, INR in the last 72 hours.   Recent Labs Lab 07/12/15 2201  AST 158*  ALT 273*  ALKPHOS 292*  BILITOT 5.3*  PROT 5.9*  ALBUMIN 3.2*     Lipase     Component Value Date/Time   LIPASE 19* 07/12/2015 2201     Studies/Results: Dg Chest 2 View  07/12/2015  CLINICAL DATA:  Chest wall pain EXAM: CHEST  2 VIEW COMPARISON:  None. FINDINGS: The heart size and mediastinal contours are within normal limits. Both lungs are clear. The visualized skeletal structures are unremarkable. IMPRESSION: No active cardiopulmonary disease. Electronically Signed   By: Natasha MeadLiviu  Pop M.D.   On: 07/12/2015 18:49   [place] Abdomen Limited  Ruq  07/13/2015  CLINICAL DATA:  24 year old female with right upper quadrant abdominal pain, vomiting for 2 days and transaminitis. EXAM: US ABDOMEN LIMITED - RIGHT UPPER QUADRANT COMPARISON:  None. FINDINGS: Gallbladder: Multiple mobile gallstones are identified, measuring up to 8 mm. Mild gallbladder wall thickening is present. There is no evidence of pericholecystic fluid or sonographic Murphy's sign. Common bile duct: Diameter: 9 mm.  Intrahepatic dilatation is identified. Liver: No focal lesions identified. There is no evidence of free fluid within the right upper abdomen. IMPRESSION: Cholelithiasis and mild gallbladder wall thickening which may represent acute cholecystitis. Intrahepatic and extrahepatic biliary dilatation - likely distal obstructing cause/choledocholithiasis. Electronically Signed   By: Harmon PierJeffrey  Hu M.D.   On: 07/13/2015 00:04    Medications: . [START ON 07/14/2015] cefTRIAXone (ROCEPHIN)  IV  2 g Intravenous Q24H  . famotidine (PEPCID) IV  20 mg Intravenous Q24H  . heparin  5,000 Units Subcutaneous 3 times per day  . sodium chloride  3 mL Intravenous Q12H    Assessment/Plan Cholecystitis/choledocholithiasis Hx of anxiety/OCD/depression  Plan:  We will let GI see her and follow with you.         LOS: 0 days    Denae Zulueta 07/13/2015

## 2015-07-13 NOTE — Anesthesia Procedure Notes (Signed)
Procedure Name: Intubation Date/Time: 07/13/2015 12:22 PM Performed by: Delphia GratesHANDLER, Maggi Hershkowitz Pre-anesthesia Checklist: Emergency Drugs available, Suction available, Patient identified and Patient being monitored Patient Re-evaluated:Patient Re-evaluated prior to inductionOxygen Delivery Method: Circle system utilized Preoxygenation: Pre-oxygenation with 100% oxygen Intubation Type: IV induction Laryngoscope Size: Mac and 4 Grade View: Grade I Tube type: Oral Tube size: 7.5 mm Number of attempts: 1 Airway Equipment and Method: Stylet Secured at: 19 cm Tube secured with: Tape Dental Injury: Teeth and Oropharynx as per pre-operative assessment

## 2015-07-13 NOTE — H&P (Signed)
[facility] History and Physical  Patient: Katherine Morrow  MRN: [id number]  DOB: Jul 17, 1991  DOS: the patient was seen and examined on 07/13/2015 PCP: No primary care provider on file.  Referring physician: Dr Mora Bellman Chief Complaint: Right-sided pain  HPI: Katherine Morrow is a 24 y.o. female with Past medical history of anxiety. Patient presents with complains of right-sided pain. The pain is located in the right lower rib cage as well as right upper abdomen. This is been ongoing for last 1 week and occurring on and off. Initially the patient and the family, the patient is having recurrent anxiety attacks and they were treating it symptomatically. But since the pain was not improving the patient was seen by one of her therapist at [facility] and they felt that this pain is out of proportion for any anxiety attack and therefore they sent the patient to the ER. The patient denies having any fever until today she also had some chills. She also complains of some nausea and vomiting. She denies any diarrhea or constipation. Denies any burning urination. He does not take any medication at her baseline. Denies any alcohol abuse.  The patient is coming from home  At her baseline ambulates no And is independent for most of her ADL; manages her medication on her own.  Review of Systems: as mentioned in the history of present illness.  A comprehensive review of the other systems is negative.  Past Medical History  Diagnosis Date  . Anxiety   . OCD (obsessive compulsive disorder)   . Depression    History reviewed. No pertinent past surgical history. Social History:  reports that she has never smoked. She does not have any smokeless tobacco history on file. She reports that she drinks alcohol. She reports that she uses illicit drugs (Marijuana).  Allergies  Allergen Reactions  . Sulfa Antibiotics Other (See Comments)    Patient doesn't know    History reviewed. No pertinent family  history.  Prior to Admission medications   Medication Sig Start Date End Date Taking? Authorizing Provider  ibuprofen (ADVIL,MOTRIN) 200 MG tablet Take 400 mg by mouth 2 (two) times daily as needed.   Yes Historical Provider, MD  OVER THE COUNTER MEDICATION Take 15 mLs by mouth daily. Moringa powder, 1 tablespoon mixed in 8oz of liquid   Yes Historical Provider, MD    Physical Exam: Filed Vitals:   07/13/15 0300 07/13/15 0330 07/13/15 0400 07/13/15 0548  BP: 122/63 117/65 128/76 129/67  Pulse:  92 100 69  Temp:  98.4 F (36.9 C)  98.4 F (36.9 C)  TempSrc:  Oral  Oral  Resp: Height:     (1.626 m)  Weight:    87.1 kg (192 lb 0.3 oz)  SpO2:  98% 94% 100%    General: Alert, Awake and Oriented to Time, Place and Person. Appear in mild distress Eyes: PERRL ENT: Oral Mucosa clear moist. Neck: no JVD Cardiovascular: S1 and S2 Present, no Murmur, Peripheral Pulses Present Respiratory: Bilateral Air entry equal and Decreased,  Clear to Auscultation, no Crackles, no wheezes Abdomen: Bowel Sound present, Soft and no tenderness Skin: no Rash Extremities: n Pedal edema, on calf tenderness Neurologic: Grossly no focal neuro deficit.o  Labs on Admission:  CBC:  Recent Labs Lab 07/12/15 2201  WBC 10.0  HGB 11.8*  HCT 35.3*  MCV 90.5  PLT 307    CMP     Component Value Date/Time   NA 134* 07/12/2015  2201   K 4.3 07/12/2015 2201   CL 111 07/12/2015 2201   CO2 12* 07/12/2015 2201   GLUCOSE 74 07/12/2015 2201   BUN <5* 07/12/2015 2201   CREATININE <0.30* 07/12/2015 2201   CALCIUM 8.1* 07/12/2015 2201   PROT 5.9* 07/12/2015 2201   ALBUMIN 3.2* 07/12/2015 2201   AST 158* 07/12/2015 2201   ALT 273* 07/12/2015 2201   ALKPHOS 292* 07/12/2015 2201   BILITOT 5.3* 07/12/2015 2201   GFRNONAA NOT CALCULATED 07/12/2015 2201   GFRAA NOT CALCULATED 07/12/2015 2201    No results for input(s): CKTOTAL, CKMB, CKMBINDEX, TROPONINI in the last 168 hours. BNP (last 3  results) No results for input(s): BNP in the last 8760 hours.  ProBNP (last 3 results) No results for input(s): PROBNP in the last 8760 hours.   Radiological Exams on Admission: Dg Chest 2 View  07/12/2015  CLINICAL DATA:  Chest wall pain EXAM: CHEST  2 VIEW COMPARISON:  None. FINDINGS: The heart size and mediastinal contours are within normal limits. Both lungs are clear. The visualized skeletal structures are unremarkable. IMPRESSION: No active cardiopulmonary disease. Electronically Signed   By: Natasha MeadLiviu  Pop M.D.   On: 07/12/2015 18:49   [place] Abdomen Limited Ruq  07/13/2015  CLINICAL DATA:  24 year old female with right upper quadrant abdominal pain, vomiting for 2 days and transaminitis. EXAM: US ABDOMEN LIMITED - RIGHT UPPER QUADRANT COMPARISON:  None. FINDINGS: Gallbladder: Multiple mobile gallstones are identified, measuring up to 8 mm. Mild gallbladder wall thickening is present. There is no evidence of pericholecystic fluid or sonographic Murphy's sign. Common bile duct: Diameter: 9 mm.  Intrahepatic dilatation is identified. Liver: No focal lesions identified. There is no evidence of free fluid within the right upper abdomen. IMPRESSION: Cholelithiasis and mild gallbladder wall thickening which may represent acute cholecystitis. Intrahepatic and extrahepatic biliary dilatation - likely distal obstructing cause/choledocholithiasis. Electronically Signed   By: Harmon PierJeffrey  Hu M.D.   On: 07/13/2015 00:04   Assessment/Plan 1. Biliary obstruction Patient presents with complaints of right-sided abdominal pain. Ultrasound shows gallbladder stone as well as a gallbladder wall thickening suggesting acute cholecystitis. It also shows intra-and extrahepatic biliary duct medication. CT scan also shows a minimal reduction medication without any evidence of obstruction. Discussed with surgery as well as gastroenterology Dr. Russella DarStark They will follow-up with the patient. Currently patient will be given IV  ceftriaxone and IV fluids. IV Pepcid as well. IV morphine as needed for pain.  Nutrition: Nothing by mouth except medication DVT Prophylaxis: subcutaneous Heparin  Advance goals of care discussion: Full code   Consults: Gen. surgery as well as gastroenterology  Family Communication: family was present at bedside, opportunity was given to ask question and all questions were answered satisfactorily at the time of interview. Disposition: Admitted as inpatient, med-surge unit.  Author: Lynden OxfordPranav Sanam Marmo, MD [facility] Pager: [phone] 07/13/2015  If 7PM-7AM, please contact night-coverage [email or web address] Password [email or web address]

## 2015-07-13 NOTE — Transfer of Care (Signed)
Immediate Anesthesia Transfer of Care Note  Patient: Katherine Morrow  Procedure(s) Performed: Procedure(s): ENDOSCOPIC RETROGRADE CHOLANGIOPANCREATOGRAPHY (ERCP) (N/A)  Patient Location: PACU and Endoscopy Unit  Anesthesia Type:General  Level of Consciousness: awake, alert , oriented and patient cooperative  Airway & Oxygen Therapy: Patient Spontanous Breathing and Patient connected to face mask oxygen  Post-op Assessment: Report given to RN and Post -op Vital signs reviewed and stable  Post vital signs: Reviewed and stable  Last Vitals:  Filed Vitals:   07/13/15 1201  BP:   Pulse:   Temp: 36.9 C  Resp:     Complications: No apparent anesthesia complications

## 2015-07-14 ENCOUNTER — Encounter (HOSPITAL_COMMUNITY): Payer: Self-pay | Admitting: Internal Medicine

## 2015-07-14 ENCOUNTER — Inpatient Hospital Stay (HOSPITAL_COMMUNITY): Payer: Self-pay

## 2015-07-14 ENCOUNTER — Inpatient Hospital Stay (HOSPITAL_COMMUNITY): Payer: MEDICAID | Admitting: Anesthesiology

## 2015-07-14 ENCOUNTER — Inpatient Hospital Stay (HOSPITAL_COMMUNITY): Payer: Self-pay | Admitting: Anesthesiology

## 2015-07-14 ENCOUNTER — Encounter (HOSPITAL_COMMUNITY)
Admission: EM | Disposition: A | Discharge status: Home / Self Care | Payer: Self-pay | Source: Home / Self Care | Attending: Emergency Medicine

## 2015-07-14 DIAGNOSIS — K8010 Calculus of gallbladder with chronic cholecystitis without obstruction: Secondary | ICD-10-CM

## 2015-07-14 DIAGNOSIS — K819 Cholecystitis, unspecified: Secondary | ICD-10-CM

## 2015-07-14 HISTORY — PX: CHOLECYSTECTOMY: SHX55

## 2015-07-14 LAB — URINE CULTURE: CULTURE: NO GROWTH

## 2015-07-14 LAB — COMPREHENSIVE METABOLIC PANEL
ALT: 239 U/L — AB (ref 14–54)
ANION GAP: 9 (ref 5–15)
AST: 95 U/L — ABNORMAL HIGH (ref 15–41)
Albumin: 3.2 g/dL — ABNORMAL LOW (ref 3.5–5.0)
Alkaline Phosphatase: 318 U/L — ABNORMAL HIGH (ref 38–126)
BUN: <5 mg/dL — ABNORMAL LOW (ref 6–20)
CHLORIDE: 104 mmol/L (ref 101–111)
CO2: 19 mmol/L — AB (ref 22–32)
CREATININE: 0.5 mg/dL (ref 0.44–1.00)
Calcium: 8.4 mg/dL — ABNORMAL LOW (ref 8.9–10.3)
Glucose, Bld: 80 mg/dL (ref 65–99)
POTASSIUM: 3.7 mmol/L (ref 3.5–5.1)
SODIUM: 132 mmol/L — AB (ref 135–145)
Total Bilirubin: 3.1 mg/dL — ABNORMAL HIGH (ref 0.3–1.2)
Total Protein: 6.2 g/dL — ABNORMAL LOW (ref 6.5–8.1)

## 2015-07-14 LAB — CBC
HCT: 35.9 % — ABNORMAL LOW (ref 36.0–46.0)
Hemoglobin: 12.1 g/dL (ref 12.0–15.0)
MCH: 30.5 pg (ref 26.0–34.0)
MCHC: 33.7 g/dL (ref 30.0–36.0)
MCV: 90.4 fL (ref 78.0–100.0)
PLATELETS: 324 10*3/uL (ref 150–400)
RBC: 3.97 MIL/uL (ref 3.87–5.11)
RDW: 13.3 % (ref 11.5–15.5)
WBC: 8.3 10*3/uL (ref 4.0–10.5)

## 2015-07-14 LAB — LIPASE, BLOOD: LIPASE: 39 U/L (ref 22–51)

## 2015-07-14 SURGERY — LAPAROSCOPIC CHOLECYSTECTOMY WITH INTRAOPERATIVE CHOLANGIOGRAM
Anesthesia: General | Site: Abdomen

## 2015-07-14 MED ORDER — LACTATED RINGERS IV SOLN
INTRAVENOUS | Status: DC
  Administered 2015-07-14: 12:00:00 via INTRAVENOUS

## 2015-07-14 MED ORDER — BUPIVACAINE-EPINEPHRINE 0.5% -1:200000 IJ SOLN
INTRAMUSCULAR | Status: AC
  Filled 2015-07-14: qty 1

## 2015-07-14 MED ORDER — HYDROMORPHONE HCL 1 MG/ML IJ SOLN
INTRAMUSCULAR | Status: AC
  Filled 2015-07-14: qty 1

## 2015-07-14 MED ORDER — GLYCOPYRROLATE 0.2 MG/ML IJ SOLN
INTRAMUSCULAR | Status: AC
  Filled 2015-07-14: qty 2

## 2015-07-14 MED ORDER — LIDOCAINE HCL (CARDIAC) 20 MG/ML IV SOLN
INTRAVENOUS | Status: DC | PRN
  Administered 2015-07-14: 50 mg via INTRAVENOUS

## 2015-07-14 MED ORDER — MIDAZOLAM HCL 2 MG/2ML IJ SOLN
INTRAMUSCULAR | Status: AC
  Filled 2015-07-14: qty 4

## 2015-07-14 MED ORDER — SODIUM CHLORIDE 0.9 % IV SOLN
INTRAVENOUS | Status: DC | PRN
  Administered 2015-07-14: 17 mL

## 2015-07-14 MED ORDER — LIP MEDEX EX OINT
TOPICAL_OINTMENT | CUTANEOUS | Status: AC
  Filled 2015-07-14: qty 7

## 2015-07-14 MED ORDER — PROMETHAZINE HCL 25 MG/ML IJ SOLN
6.2500 mg | INTRAMUSCULAR | Status: DC | PRN
  Administered 2015-07-14: 6.25 mg via INTRAVENOUS

## 2015-07-14 MED ORDER — FENTANYL CITRATE (PF) 100 MCG/2ML IJ SOLN
INTRAMUSCULAR | Status: DC | PRN
  Administered 2015-07-14 (×2): 50 ug via INTRAVENOUS
  Administered 2015-07-14: 100 ug via INTRAVENOUS
  Administered 2015-07-14: 50 ug via INTRAVENOUS
  Administered 2015-07-14: 100 ug via INTRAVENOUS

## 2015-07-14 MED ORDER — GLYCOPYRROLATE 0.2 MG/ML IJ SOLN
INTRAMUSCULAR | Status: DC | PRN
  Administered 2015-07-14: 0.6 mg via INTRAVENOUS

## 2015-07-14 MED ORDER — PROPOFOL 10 MG/ML IV BOLUS
INTRAVENOUS | Status: DC | PRN
  Administered 2015-07-14: 200 mg via INTRAVENOUS

## 2015-07-14 MED ORDER — MIDAZOLAM HCL 5 MG/5ML IJ SOLN
INTRAMUSCULAR | Status: DC | PRN
  Administered 2015-07-14: 2 mg via INTRAVENOUS

## 2015-07-14 MED ORDER — HYDROMORPHONE HCL 1 MG/ML IJ SOLN
0.2500 mg | INTRAMUSCULAR | Status: DC | PRN
  Administered 2015-07-14 (×4): 0.5 mg via INTRAVENOUS

## 2015-07-14 MED ORDER — BUPIVACAINE-EPINEPHRINE 0.25% -1:200000 IJ SOLN
INTRAMUSCULAR | Status: AC
  Filled 2015-07-14: qty 1

## 2015-07-14 MED ORDER — PROMETHAZINE HCL 25 MG/ML IJ SOLN
INTRAMUSCULAR | Status: AC
Start: 2015-07-14 — End: 2015-07-15
  Filled 2015-07-14: qty 1

## 2015-07-14 MED ORDER — KCL IN DEXTROSE-NACL 20-5-0.45 MEQ/L-%-% IV SOLN
INTRAVENOUS | Status: DC
  Administered 2015-07-14: 16:00:00 via INTRAVENOUS
  Filled 2015-07-14 (×3): qty 1000

## 2015-07-14 MED ORDER — BUPIVACAINE-EPINEPHRINE 0.5% -1:200000 IJ SOLN
INTRAMUSCULAR | Status: DC | PRN
  Administered 2015-07-14: 20 mL

## 2015-07-14 MED ORDER — PROPOFOL 10 MG/ML IV BOLUS
INTRAVENOUS | Status: AC
  Filled 2015-07-14: qty 20

## 2015-07-14 MED ORDER — NEOSTIGMINE METHYLSULFATE 10 MG/10ML IV SOLN
INTRAVENOUS | Status: DC | PRN
  Administered 2015-07-14: 4 mg via INTRAVENOUS

## 2015-07-14 MED ORDER — ONDANSETRON HCL 4 MG/2ML IJ SOLN
INTRAMUSCULAR | Status: AC
  Filled 2015-07-14: qty 2

## 2015-07-14 MED ORDER — SUCCINYLCHOLINE CHLORIDE 20 MG/ML IJ SOLN
INTRAMUSCULAR | Status: DC | PRN
  Administered 2015-07-14: 100 mg via INTRAVENOUS

## 2015-07-14 MED ORDER — FENTANYL CITRATE (PF) 100 MCG/2ML IJ SOLN
INTRAMUSCULAR | Status: AC
  Filled 2015-07-14: qty 4

## 2015-07-14 MED ORDER — ROCURONIUM BROMIDE 100 MG/10ML IV SOLN
INTRAVENOUS | Status: AC
  Filled 2015-07-14: qty 1

## 2015-07-14 MED ORDER — FENTANYL CITRATE (PF) 100 MCG/2ML IJ SOLN
25.0000 ug | INTRAMUSCULAR | Status: DC | PRN
  Administered 2015-07-14: 25 ug via INTRAVENOUS
  Administered 2015-07-14: 50 ug via INTRAVENOUS
  Administered 2015-07-14: 25 ug via INTRAVENOUS

## 2015-07-14 MED ORDER — FENTANYL CITRATE (PF) 100 MCG/2ML IJ SOLN
INTRAMUSCULAR | Status: AC
  Filled 2015-07-14: qty 2

## 2015-07-14 MED ORDER — ONDANSETRON HCL 4 MG/2ML IJ SOLN
INTRAMUSCULAR | Status: DC | PRN
Start: 2015-07-14 — End: 2015-07-14
  Administered 2015-07-14: 4 mg via INTRAVENOUS

## 2015-07-14 MED ORDER — DEXAMETHASONE SODIUM PHOSPHATE 10 MG/ML IJ SOLN
INTRAMUSCULAR | Status: DC | PRN
  Administered 2015-07-14: 10 mg via INTRAVENOUS

## 2015-07-14 MED ORDER — LACTATED RINGERS IV SOLN: INTRAVENOUS | Status: DC | PRN

## 2015-07-14 MED ORDER — FENTANYL CITRATE (PF) 250 MCG/5ML IJ SOLN
INTRAMUSCULAR | Status: AC
  Filled 2015-07-14: qty 25

## 2015-07-14 MED ORDER — ROCURONIUM BROMIDE 100 MG/10ML IV SOLN
INTRAVENOUS | Status: DC | PRN
  Administered 2015-07-14: 40 mg via INTRAVENOUS

## 2015-07-14 MED ORDER — PROMETHAZINE HCL 25 MG/ML IJ SOLN: 6.2500 mg | INTRAMUSCULAR | Status: DC | PRN

## 2015-07-14 MED ORDER — LACTATED RINGERS IR SOLN
Status: DC | PRN
  Administered 2015-07-14: 1000 mL

## 2015-07-14 SURGICAL SUPPLY — 32 items
APPLIER CLIP ROT 10 11.4 M/L (STAPLE) ×3
BENZOIN TINCTURE PRP APPL 2/3 (GAUZE/BANDAGES/DRESSINGS) ×3 IMPLANT
CABLE HIGH FREQUENCY MONO STRZ (ELECTRODE) ×3 IMPLANT
CHLORAPREP W/TINT 26ML (MISCELLANEOUS) ×3 IMPLANT
CLIP APPLIE ROT 10 11.4 M/L (STAPLE) ×1 IMPLANT
CLOSURE WOUND 1/2 X4 (GAUZE/BANDAGES/DRESSINGS) ×1
COVER MAYO STAND STRL (DRAPES) ×3 IMPLANT
COVER SURGICAL LIGHT HANDLE (MISCELLANEOUS) ×3 IMPLANT
DECANTER SPIKE VIAL GLASS SM (MISCELLANEOUS) ×3 IMPLANT
DRAPE C-ARM 42X120 X-RAY (DRAPES) ×3 IMPLANT
DRAPE LAPAROSCOPIC ABDOMINAL (DRAPES) ×3 IMPLANT
ELECT REM PT RETURN 9FT ADLT (ELECTROSURGICAL) ×3
ELECTRODE REM PT RTRN 9FT ADLT (ELECTROSURGICAL) ×1 IMPLANT
GAUZE SPONGE 2X2 8PLY STRL LF (GAUZE/BANDAGES/DRESSINGS) ×1 IMPLANT
GLOVE SURG ORTHO 8.0 STRL STRW (GLOVE) ×3 IMPLANT
GOWN STRL REUS W/TWL XL LVL3 (GOWN DISPOSABLE) ×6 IMPLANT
HEMOSTAT SURGICEL 4X8 (HEMOSTASIS) IMPLANT
KIT BASIN OR (CUSTOM PROCEDURE TRAY) ×3 IMPLANT
POUCH SPECIMEN RETRIEVAL 10MM (ENDOMECHANICALS) ×3 IMPLANT
SCISSORS LAP 5X35 DISP (ENDOMECHANICALS) ×3 IMPLANT
SET CHOLANGIOGRAPH MIX (MISCELLANEOUS) ×3 IMPLANT
SET IRRIG TUBING LAPAROSCOPIC (IRRIGATION / IRRIGATOR) ×3 IMPLANT
SLEEVE XCEL OPT CAN 5 100 (ENDOMECHANICALS) ×3 IMPLANT
SPONGE GAUZE 2X2 STER 10/PKG (GAUZE/BANDAGES/DRESSINGS) ×2
STRIP CLOSURE SKIN 1/2X4 (GAUZE/BANDAGES/DRESSINGS) ×2 IMPLANT
SUT MNCRL AB 4-0 PS2 18 (SUTURE) ×3 IMPLANT
TAPE CLOTH SURG 4X10 WHT LF (GAUZE/BANDAGES/DRESSINGS) ×3 IMPLANT
TOWEL OR 17X26 10 PK STRL BLUE (TOWEL DISPOSABLE) ×3 IMPLANT
TRAY LAPAROSCOPIC (CUSTOM PROCEDURE TRAY) ×3 IMPLANT
TROCAR BLADELESS OPT 5 100 (ENDOMECHANICALS) ×3 IMPLANT
TROCAR XCEL BLUNT TIP 100MML (ENDOMECHANICALS) ×3 IMPLANT
TROCAR XCEL NON-BLD 11X100MML (ENDOMECHANICALS) ×3 IMPLANT

## 2015-07-14 NOTE — Transfer of Care (Signed)
Immediate Anesthesia Transfer of Care Note  Patient: Katherine Morrow  Procedure(s) Performed: Procedure(s): LAPAROSCOPIC CHOLECYSTECTOMY WITH INTRAOPERATIVE CHOLANGIOGRAM (N/A)  Patient Location: PACU  Anesthesia Type:General  Level of Consciousness: sedated  Airway & Oxygen Therapy: Patient Spontanous Breathing and Patient connected to face mask oxygen  Post-op Assessment: Report given to RN and Post -op Vital signs reviewed and stable  Post vital signs: Reviewed and stable  Last Vitals:  Filed Vitals:   07/14/15 0527  BP: 122/70  Pulse: 65  Temp: 36.9 C  Resp: 18    Complications: No apparent anesthesia complications

## 2015-07-14 NOTE — Anesthesia Preprocedure Evaluation (Signed)
Anesthesia Evaluation  Patient identified by MRN, date of birth, ID band Patient awake    Reviewed: Allergy & Precautions, NPO status , Patient's Chart, lab work & pertinent test results  Airway Mallampati: II  TM Distance: >3 FB Neck ROM: Full    Dental no notable dental hx.    Pulmonary neg pulmonary ROS,    Pulmonary exam normal breath sounds clear to auscultation       Cardiovascular Exercise Tolerance: Good negative cardio ROS Normal cardiovascular exam Rhythm:Regular Rate:Normal     Neuro/Psych PSYCHIATRIC DISORDERS Anxiety Depression OCD Anxiety negative neurological ROS     GI/Hepatic negative GI ROS, Neg liver ROS,   Endo/Other  negative endocrine ROS  Renal/GU negative Renal ROS  negative genitourinary   Musculoskeletal negative musculoskeletal ROS (+)   Abdominal (+) + obese,   Peds negative pediatric ROS (+)  Hematology negative hematology ROS (+)   Anesthesia Other Findings   Reproductive/Obstetrics negative OB ROS                             Anesthesia Physical Anesthesia Plan  ASA: II  Anesthesia Plan: General   Post-op Pain Management:    Induction: Intravenous  Airway Management Planned: Oral ETT  Additional Equipment:   Intra-op Plan:   Post-operative Plan: Extubation in OR  Informed Consent: I have reviewed the patients History and Physical, chart, labs and discussed the procedure including the risks, benefits and alternatives for the proposed anesthesia with the patient or authorized representative who has indicated his/her understanding and acceptance.   Dental advisory given  Plan Discussed with: CRNA  Anesthesia Plan Comments:         Anesthesia Quick Evaluation

## 2015-07-14 NOTE — Op Note (Signed)
Procedure Note  Pre-operative Diagnosis:  Chronic cholecystitis, cholelithiasis, choledocholithiasis  Post-operative Diagnosis:  same  Surgeon:  Velora Hecklerodd M. Roiza Wiedel, MD, FACS  Assistant:  none   Procedure:  Laparoscopic cholecystectomy with intra-operative cholangiography  Anesthesia:  General  Estimated Blood Loss:  minimal  Drains: none         Specimen: Gallbladder to pathology  Indications:  24 yo WF with chronic cholecystitis, cholelithiasis.  Presented with CBD stone and obstruction.  Underwent successful ERCP.  Now for lap chole.  Procedure Details:  The patient was seen in the pre-op holding area. The risks, benefits, complications, treatment options, and expected outcomes were previously discussed with the patient. The patient agreed with the proposed plan and has signed the informed consent form.  The patient was brought to the Operating Room, identified as Katherine Morrow and the procedure verified as laparoscopic cholecystectomy with intraoperative cholangiography. A "time out" was completed and the above information confirmed.  The patient was placed in the supine position. Following induction of general anesthesia, the abdomen was prepped and draped in the usual aseptic fashion.  An incision was made in the skin near the umbilicus. The midline fascia was incised and the peritoneal cavity was entered and a Hasson canula was introduced under direct vision.  The Hasson canula was secured with a 0-Vicryl pursestring suture. Pneumoperitoneum was established with carbon dioxide. Additional trocars were introduced under direct vision along the right costal margin in the midline, mid-clavicular line, and anterior axillary line.   The gallbladder was identified and the fundus grasped and retracted cephalad. Adhesions were taken down bluntly and the electrocautery was utilized as needed, taking care not to injure any adjacent structures. The infundibulum was grasped and retracted laterally,  exposing the peritoneum overlying the triangle of Calot. The peritoneum was incised and structures exposed with blunt dissection. The cystic duct was clearly identified, bluntly dissected circumferentially, and clipped at the neck of the gallbladder.  An incision was made in the cystic duct and the cholangiogram catheter introduced. The catheter was secured using an ligaclip.  Real-time cholangiography was performed using C-arm fluoroscopy.  There was rapid filling of a normal caliber common bile duct.  There was reflux of contrast into the left and right hepatic ductal systems.  There was free flow distally into the duodenum without filling defect or obstruction.  The catheter was removed from the peritoneal cavity.  The cystic duct was then ligated with surgical clips and divided. The cystic artery was identified, dissected circumferentially, ligated with ligaclips, and divided.  The gallbladder was dissected away from the liver bed using the electrocautery for hemostasis. The gallbladder was completely removed from the liver and placed into an endocatch bag. The gallbladder was removed in the endocatch bag through the umbilical port site and submitted to pathology for review.  The right upper quadrant was irrigated and the gallbladder bed was inspected. Hemostasis was achieved with the electrocautery.  Pneumoperitoneum was released after viewing removal of the trocars with good hemostasis noted. The umbilical wound was irrigated and the fascia was then closed with the pursestring suture.  Local anesthetic was infiltrated at all port sites. The skin incisions were closed with 4-0 Monocril subcuticular sutures and steri-strips and dressings were applied.  Instrument, sponge, and needle counts were correct at the conclusion of the case.  The patient was awakened from anesthesia and brought to the recovery room in stable condition.  The patient tolerated the procedure well.   Velora Hecklerodd M. Byran Bilotti, MD,  FACS  [facility] Office: [phone]

## 2015-07-14 NOTE — Progress Notes (Signed)
[facility] PROGRESS NOTE  Bryn Saline ZOX:[id number] DOB: 29-Jan-1991 DOA: 07/12/2015 PCP: No primary care provider on file.  Assessment/Plan: 1. right quadrant abdominal pain/ acute cholecystitis, choledocholithiasis, biliary obstruction - s/p ERCP WITH sphincterotomy and stone extraction.  - am labs reveal improving liver function tests.  - underwent lap chole with intra operative cholangiography.  - hydration with IV fluids, pain control and anti emetics.  - check liver function test in am.      Code Status: full code  Family Communication: discussed with family at bedside.  Disposition Plan: possibly home in a day or two.    Consultants:  Gastroenterology   Surgery.   Procedures: ERCP by GI 10/12  10/13 Lap chole Surgery with intra-operative cholangiography   Antibiotics:  non  HPI/Subjective: Wants to know if she can get more pain medications.   Objective: Filed Vitals:   07/14/15 1420  BP: 135/78  Pulse: 74  Temp: 98.7 F (37.1 C)  Resp: 16    Intake/Output Summary (Last 24 hours) at 07/14/15 1824 Last data filed at 07/14/15 1400  Gross per 24 hour  Intake    300 ml  Output   1150 ml  Net   -850 ml   Filed Weights   07/13/15 0548 07/14/15 0527  Weight: 87.1 kg (192 lb 0.3 oz) 85.276 kg (188 lb)    Exam:   General: alert in mild distress from pain.    Cardiovascular:s1s2  Respiratory: diminished at bases.   Abdomen: soft mildly tender and mildly distended.  bs+  Musculoskeletal: no pedal edema.   Data Reviewed: Basic Metabolic Panel:  Recent Labs Lab 07/12/15 2201 07/13/15 0720 07/14/15 0355  NA 134* 136 132*  K 4.3 3.2* 3.7  CL 111 107 104  CO2 12* 15* 19*  GLUCOSE 74 80 80  BUN <5* 6 <5*  CREATININE <0.30* 0.38* 0.50  CALCIUM 8.1* 8.8* 8.4*   Liver Function Tests:  Recent Labs Lab 07/12/15 2201 07/13/15 0720 07/14/15 0355  AST 158* 177* 95*  ALT 273* 306* 239*  ALKPHOS 292* 358* 318*  BILITOT 5.3* 6.5*  3.1*  PROT 5.9* 6.9 6.2*  ALBUMIN 3.2* 3.7 3.2*    Recent Labs Lab 07/12/15 2201 07/14/15 0355  LIPASE 19* 39   No results for input(s): AMMONIA in the last 168 hours. CBC:  Recent Labs Lab 07/12/15 2201 07/13/15 0720 07/14/15 0355  WBC 10.0 7.9 8.3  NEUTROABS  --  4.8  --   HGB 11.8* 12.7 12.1  HCT 35.3* 38.3 35.9*  MCV 90.5 91.2 90.4  PLT 307 310 324   Cardiac Enzymes: No results for input(s): CKTOTAL, CKMB, CKMBINDEX, TROPONINI in the last 168 hours. BNP (last 3 results) No results for input(s): BNP in the last 8760 hours.  ProBNP (last 3 results) No results for input(s): PROBNP in the last 8760 hours.  CBG: No results for input(s): GLUCAP in the last 168 hours.  Recent Results (from the past 240 hour(s))  Urine culture     Status: None   Collection Time: 07/12/15 10:45 PM  Result Value Ref Range Status   Specimen Description URINE, RANDOM  Final   Special Requests NONE  Final   Culture   Final    NO GROWTH 1 DAY Performed at [facility]    Report Status 07/14/2015 FINAL  Final     Studies: Dg Chest 2 View  07/12/2015  CLINICAL DATA:  Chest wall pain EXAM: CHEST  2 VIEW COMPARISON:  None. FINDINGS:  The heart size and mediastinal contours are within normal limits. Both lungs are clear. The visualized skeletal structures are unremarkable. IMPRESSION: No active cardiopulmonary disease. Electronically Signed   By: Natasha Mead M.D.   On: 07/12/2015 18:49   Dg Cholangiogram Operative  07/14/2015  CLINICAL DATA:  Gallstones EXAM: INTRAOPERATIVE CHOLANGIOGRAM TECHNIQUE: Cholangiographic images from the C-arm fluoroscopic device were submitted for interpretation post-operatively. Please see the procedural report for the amount of contrast and the fluoroscopy time utilized. COMPARISON:  None. FINDINGS: Contrast fills the biliary tree and duodenum without filling defects in a common bile ducts. IMPRESSION: Patent biliary tree. Electronically Signed   By: Jolaine Click M.D.   On: 07/14/2015 14:00   Ct Abdomen Pelvis W Contrast  07/13/2015  CLINICAL DATA:  24 year old female with acute abdomen and back pain. Symptoms for 5 days. EXAM: CT abdomen and pelvis with contrast CONTRAST:  100 mL Omnipaque 300 COMPARISON:  Right upper quadrant ultrasound 07/12/2015 FINDINGS: Negative lung bases.  No pericardial or pleural effusion. No osseous abnormality identified. No pelvic free fluid. Negative uterus, at adnexa and distal colon. Distended but otherwise unremarkable urinary bladder. Redundant sigmoid colon otherwise is negative. Decompressed left colon. Negative transverse colon, right colon, appendix, small bowel, stomach, duodenum, spleen, pancreas, adrenal glands, and kidneys. No abdominal free fluid. Intra and extrahepatic biliary ductal dilatation. The CBD measures up to 11 mm diameter and tapers to the junction with the duodenum. No discrete filling defect evident by CT. No pericholecystic inflammation is evident by CT. No main pancreatic ductal dilatation. No discrete liver lesion. Portal venous system is patent. Major arterial structures are patent. No lymphadenopathy. IMPRESSION: 1. Biliary obstruction with intra and extrahepatic biliary ductal dilatation re- demonstrated. The CBD tapers to the duodenum. No distinct filling defect by CT. See also right upper quadrant ultrasound from 07/12/2015. 2. No other acute or inflammatory process in the abdomen or pelvis. Electronically Signed   By: Odessa Fleming M.D.   On: 07/13/2015 01:49   Dg Ercp Biliary & Pancreatic Ducts  07/13/2015  CLINICAL DATA:  ERCP with sphincterotomy, balloon sweeps performed, stone removed. Evaluate patency of CBD. 8 min 11 sec fl time. Dr. Marina Goodell requests radiologist to page him after interpretation of images [phone]. EXAM: ERCP TECHNIQUE: Multiple spot images obtained with the fluoroscopic device and submitted for interpretation post-procedure. FLUOROSCOPY TIME:  8 min 11 sec COMPARISON:  CT  07/13/2015 FINDINGS: A series of fluoroscopic spot images document endoscopic cannulation and opacification of the CBD. The CBD is distended. Intrahepatic bile ducts are incompletely opacified, appearing mildly dilated centrally. Cystic duct is patent with partial opacification of the gallbladder lumen. Subsequent images document passage of a balloon catheter through the CBD. Filling defect is noted in the distal CBD on an intermediate image. On final images there is decompression of the biliary tree and CBD. IMPRESSION: 1. Mild intra and extrahepatic biliary ductal dilatation, with endoscopic intervention. These images were submitted for radiologic interpretation only. Please see the procedural report for the amount of contrast and the fluoroscopy time utilized. Electronically Signed   By: Corlis Leak M.D.   On: 07/13/2015 15:08   US Abdomen Limited Ruq  07/13/2015  CLINICAL DATA:  24 year old female with right upper quadrant abdominal pain, vomiting for 2 days and transaminitis. EXAM: US ABDOMEN LIMITED - RIGHT UPPER QUADRANT COMPARISON:  None. FINDINGS: Gallbladder: Multiple mobile gallstones are identified, measuring up to 8 mm. Mild gallbladder wall thickening is present. There is no evidence of pericholecystic fluid or  sonographic Murphy's sign. Common bile duct: Diameter: 9 mm.  Intrahepatic dilatation is identified. Liver: No focal lesions identified. There is no evidence of free fluid within the right upper abdomen. IMPRESSION: Cholelithiasis and mild gallbladder wall thickening which may represent acute cholecystitis. Intrahepatic and extrahepatic biliary dilatation - likely distal obstructing cause/choledocholithiasis. Electronically Signed   By: Harmon PierJeffrey  Hu M.D.   On: 07/13/2015 00:04    Scheduled Meds: . famotidine (PEPCID) IV  20 mg Intravenous Q24H  . fentaNYL      . HYDROmorphone      . HYDROmorphone      . lip balm      . promethazine       Continuous Infusions: . dextrose 5 % and  0.45 % NaCl with KCl 20 mEq/L 50 mL/hr at 07/14/15 1557    Principal Problem:   Biliary obstruction Active Problems:   Cholecystitis   Choledocholithiasis   Chronic cholecystitis with calculus    Time spent: 25 minutes.     [facility], LLCKULA,Antwonette Feliz  [facility] Pager [phone] If 7PM-7AM, please contact night-coverage at [email or web address], password [email or web address] 07/14/2015, 6:24 PM  LOS: 1 day

## 2015-07-14 NOTE — Anesthesia Postprocedure Evaluation (Signed)
  Anesthesia Post-op Note  Patient: Katherine Morrow  Procedure(s) Performed: Procedure(s) (LRB): LAPAROSCOPIC CHOLECYSTECTOMY WITH INTRAOPERATIVE CHOLANGIOGRAM (N/A)  Patient Location: PACU  Anesthesia Type: General  Level of Consciousness: awake and alert   Airway and Oxygen Therapy: Patient Spontanous Breathing  Post-op Pain: mild  Post-op Assessment: Post-op Vital signs reviewed, Patient's Cardiovascular Status Stable, Respiratory Function Stable, Patent Airway and No signs of Nausea or vomiting  Last Vitals:  Filed Vitals:   07/14/15 1420  BP: 135/78  Pulse: 74  Temp: 37.1 C  Resp: 16    Post-op Vital Signs: stable   Complications: No apparent anesthesia complications

## 2015-07-14 NOTE — Progress Notes (Signed)
Patient ID: Katherine Morrow, female   DOB: [birth year], 24 y.o.   MRN: [id number]  General Surgery [facility]  Subjective: Patient sleeping, mother at bedside.  No problems overnight.  Objective: Vital signs in last 24 hours: Temp:  [97.8 F (36.6 C)-98.7 F (37.1 C)] 98.4 F (36.9 C) (10/13 0527) Pulse Rate:  [55-83] 65 (10/13 0527) Resp:  [16-25] 18 (10/13 0527) BP: (117-146)/(68-87) 122/70 mmHg (10/13 0527) SpO2:  [97 %-100 %] 99 % (10/13 0527) Weight:  [85.276 kg (188 lb)] 85.276 kg (188 lb) (10/13 0527) Last BM Date: 07/11/15  Intake/Output from previous day: 10/12 0701 - 10/13 0700 In: 1240 [P.O.:240; I.V.:1000] Out: 1750 [Urine:1750] Intake/Output this shift:    Physical Exam: HEENT - sclerae clear, mucous membranes moist Neck - soft Chest - clear bilaterally Cor - RRR Abdomen - soft without distension Ext - no edema, non-tender Neuro - alert & oriented, no focal deficits  Lab Results:   Recent Labs  07/13/15 0720 07/14/15 0355  WBC 7.9 8.3  HGB 12.7 12.1  HCT 38.3 35.9*  PLT 310 324   BMET  Recent Labs  07/13/15 0720 07/14/15 0355  NA 136 132*  K 3.2* 3.7  CL 107 104  CO2 15* 19*  GLUCOSE 80 80  BUN 6 <5*  CREATININE 0.38* 0.50  CALCIUM 8.8* 8.4*   PT/INR  Recent Labs  07/13/15 0720  LABPROT 13.8  INR 1.04   Comprehensive Metabolic Panel:    Component Value Date/Time   NA 132* 07/14/2015 0355   NA 136 07/13/2015 0720   K 3.7 07/14/2015 0355   K 3.2* 07/13/2015 0720   CL 104 07/14/2015 0355   CL 107 07/13/2015 0720   CO2 19* 07/14/2015 0355   CO2 15* 07/13/2015 0720   BUN <5* 07/14/2015 0355   BUN 6 07/13/2015 0720   CREATININE 0.50 07/14/2015 0355   CREATININE 0.38* 07/13/2015 0720   GLUCOSE 80 07/14/2015 0355   GLUCOSE 80 07/13/2015 0720   CALCIUM 8.4* 07/14/2015 0355   CALCIUM 8.8* 07/13/2015 0720   AST 95* 07/14/2015 0355   AST 177* 07/13/2015 0720   ALT 239* 07/14/2015 0355   ALT 306* 07/13/2015  0720   ALKPHOS 318* 07/14/2015 0355   ALKPHOS 358* 07/13/2015 0720   BILITOT 3.1* 07/14/2015 0355   BILITOT 6.5* 07/13/2015 0720   PROT 6.2* 07/14/2015 0355   PROT 6.9 07/13/2015 0720   ALBUMIN 3.2* 07/14/2015 0355   ALBUMIN 3.7 07/13/2015 0720    Studies/Results: Dg Chest 2 View  07/12/2015  CLINICAL DATA:  Chest wall pain EXAM: CHEST  2 VIEW COMPARISON:  None. FINDINGS: The heart size and mediastinal contours are within normal limits. Both lungs are clear. The visualized skeletal structures are unremarkable. IMPRESSION: No active cardiopulmonary disease. Electronically Signed   By: Natasha Mead M.D.   On: 07/12/2015 18:49   Ct Abdomen Pelvis W Contrast  07/13/2015  CLINICAL DATA:  24 year old female with acute abdomen and back pain. Symptoms for 5 days. EXAM: CT abdomen and pelvis with contrast CONTRAST:  100 mL Omnipaque 300 COMPARISON:  Right upper quadrant ultrasound 07/12/2015 FINDINGS: Negative lung bases.  No pericardial or pleural effusion. No osseous abnormality identified. No pelvic free fluid. Negative uterus, at adnexa and distal colon. Distended but otherwise unremarkable urinary bladder. Redundant sigmoid colon otherwise is negative. Decompressed left colon. Negative transverse colon, right colon, appendix, small bowel, stomach, duodenum, spleen, pancreas, adrenal glands, and kidneys. No abdominal free fluid. Intra and extrahepatic  biliary ductal dilatation. The CBD measures up to 11 mm diameter and tapers to the junction with the duodenum. No discrete filling defect evident by CT. No pericholecystic inflammation is evident by CT. No main pancreatic ductal dilatation. No discrete liver lesion. Portal venous system is patent. Major arterial structures are patent. No lymphadenopathy. IMPRESSION: 1. Biliary obstruction with intra and extrahepatic biliary ductal dilatation re- demonstrated. The CBD tapers to the duodenum. No distinct filling defect by CT. See also right upper quadrant  ultrasound from 07/12/2015. 2. No other acute or inflammatory process in the abdomen or pelvis. Electronically Signed   By: Odessa Fleming M.D.   On: 07/13/2015 01:49   Dg Ercp Biliary & Pancreatic Ducts  07/13/2015  CLINICAL DATA:  ERCP with sphincterotomy, balloon sweeps performed, stone removed. Evaluate patency of CBD. 8 min 11 sec fl time. Dr. Marina Goodell requests radiologist to page him after interpretation of images [phone]. EXAM: ERCP TECHNIQUE: Multiple spot images obtained with the fluoroscopic device and submitted for interpretation post-procedure. FLUOROSCOPY TIME:  8 min 11 sec COMPARISON:  CT 07/13/2015 FINDINGS: A series of fluoroscopic spot images document endoscopic cannulation and opacification of the CBD. The CBD is distended. Intrahepatic bile ducts are incompletely opacified, appearing mildly dilated centrally. Cystic duct is patent with partial opacification of the gallbladder lumen. Subsequent images document passage of a balloon catheter through the CBD. Filling defect is noted in the distal CBD on an intermediate image. On final images there is decompression of the biliary tree and CBD. IMPRESSION: 1. Mild intra and extrahepatic biliary ductal dilatation, with endoscopic intervention. These images were submitted for radiologic interpretation only. Please see the procedural report for the amount of contrast and the fluoroscopy time utilized. Electronically Signed   By: Corlis Leak M.D.   On: 07/13/2015 15:08   US Abdomen Limited Ruq  07/13/2015  CLINICAL DATA:  24 year old female with right upper quadrant abdominal pain, vomiting for 2 days and transaminitis. EXAM: US ABDOMEN LIMITED - RIGHT UPPER QUADRANT COMPARISON:  None. FINDINGS: Gallbladder: Multiple mobile gallstones are identified, measuring up to 8 mm. Mild gallbladder wall thickening is present. There is no evidence of pericholecystic fluid or sonographic Murphy's sign. Common bile duct: Diameter: 9 mm.  Intrahepatic dilatation is  identified. Liver: No focal lesions identified. There is no evidence of free fluid within the right upper abdomen. IMPRESSION: Cholelithiasis and mild gallbladder wall thickening which may represent acute cholecystitis. Intrahepatic and extrahepatic biliary dilatation - likely distal obstructing cause/choledocholithiasis. Electronically Signed   By: Harmon Pier M.D.   On: 07/13/2015 00:04    Anti-infectives: Anti-infectives    Start     Dose/Rate Route Frequency Ordered Stop   07/14/15 0500  cefTRIAXone (ROCEPHIN) 2 g in dextrose 5 % 50 mL IVPB     2 g 100 mL/hr over 30 Minutes Intravenous Every 24 hours 07/13/15 0551     07/13/15 0430  cefTRIAXone (ROCEPHIN) 2 g in dextrose 5 % 50 mL IVPB     2 g 100 mL/hr over 30 Minutes Intravenous  Once 07/13/15 0425 07/13/15 0535      Assessment & Plans: Chronic cholecystitis, cholelithiasis, choledocholithiasis  ERCP successful yesterday  LFT's improving this morning, symptoms relieved  Plan to proceed with lap chole with IOC today in OR  The risks and benefits of the procedure have been discussed at length with the patient.  The patient understands the proposed procedure, potential alternative treatments, and the course of recovery to be expected.  All of the  patient's questions have been answered at this time.  The patient wishes to proceed with surgery.  Velora Hecklerodd M. Lanea Vankirk, MD, [facility] Office: [phone]   Caydan Mctavish Judie PetitM 07/14/2015

## 2015-07-14 NOTE — Anesthesia Procedure Notes (Signed)
Procedure Name: Intubation Date/Time: 07/14/2015 11:58 AM Performed by: Onna Nodal, Nuala AlphaKRISTOPHER Pre-anesthesia Checklist: Patient identified, Emergency Drugs available, Suction available, Patient being monitored and Timeout performed Patient Re-evaluated:Patient Re-evaluated prior to inductionOxygen Delivery Method: Circle system utilized Preoxygenation: Pre-oxygenation with 100% oxygen Intubation Type: IV induction Ventilation: Mask ventilation without difficulty Laryngoscope Size: Mac and 4 Grade View: Grade I Tube type: Oral Tube size: 7.5 mm Number of attempts: 1 Airway Equipment and Method: Stylet Placement Confirmation: ETT inserted through vocal cords under direct vision,  positive ETCO2,  CO2 detector and breath sounds checked- equal and bilateral Secured at: 21 cm Tube secured with: Tape Dental Injury: Teeth and Oropharynx as per pre-operative assessment

## 2015-07-14 NOTE — Progress Notes (Signed)
[facility] Progress Note  Subjective:  Feels much better today.  For cholecystectomy early this afternoon.  Objective:  Vital signs in last 24 hours: Temp:  [97.8 F (36.6 C)-98.7 F (37.1 C)] 98.4 F (36.9 C) (10/13 0527) Pulse Rate:  [55-83] 65 (10/13 0527) Resp:  [16-25] 18 (10/13 0527) BP: (117-146)/(68-87) 122/70 mmHg (10/13 0527) SpO2:  [97 %-100 %] 99 % (10/13 0527) Weight:  [188 lb (85.276 kg)] 188 lb (85.276 kg) (10/13 0527) Last BM Date: 07/11/15 General:  Alert, Well-developed, in NAD Heart:  Regular rate and rhythm; no murmurs Pulm:  CTAB.  No W/R/R. Abdomen:  Soft, non-distended. Normal bowel sounds.  Non-tender. Extremities:  Without edema. Neurologic:  Alert and  oriented x4;  grossly normal neurologically. Psych:  Alert and cooperative. Normal mood and affect.  Intake/Output from previous day: 10/12 0701 - 10/13 0700 In: 1240 [P.O.:240; I.V.:1000] Out: 1750 [Urine:1750]  Lab Results:  Recent Labs  07/12/15 2201 07/13/15 0720 07/14/15 0355  WBC 10.0 7.9 8.3  HGB 11.8* 12.7 12.1  HCT 35.3* 38.3 35.9*  PLT 307 310 324   BMET  Recent Labs  07/12/15 2201 07/13/15 0720 07/14/15 0355  NA 134* 136 132*  K 4.3 3.2* 3.7  CL 111 107 104  CO2 12* 15* 19*  GLUCOSE 74 80 80  BUN <5* 6 <5*  CREATININE <0.30* 0.38* 0.50  CALCIUM 8.1* 8.8* 8.4*   LFT  Recent Labs  07/14/15 0355  PROT 6.2*  ALBUMIN 3.2*  AST 95*  ALT 239*  ALKPHOS 318*  BILITOT 3.1*   PT/INR  Recent Labs  07/13/15 0720  LABPROT 13.8  INR 1.04   Dg Chest 2 View  07/12/2015  CLINICAL DATA:  Chest wall pain EXAM: CHEST  2 VIEW COMPARISON:  None. FINDINGS: The heart size and mediastinal contours are within normal limits. Both lungs are clear. The visualized skeletal structures are unremarkable. IMPRESSION: No active cardiopulmonary disease. Electronically Signed   By: Natasha Mead M.D.   On: 07/12/2015 18:49   Ct Abdomen Pelvis W Contrast  07/13/2015   CLINICAL DATA:  24 year old female with acute abdomen and back pain. Symptoms for 5 days. EXAM: CT abdomen and pelvis with contrast CONTRAST:  100 mL Omnipaque 300 COMPARISON:  Right upper quadrant ultrasound 07/12/2015 FINDINGS: Negative lung bases.  No pericardial or pleural effusion. No osseous abnormality identified. No pelvic free fluid. Negative uterus, at adnexa and distal colon. Distended but otherwise unremarkable urinary bladder. Redundant sigmoid colon otherwise is negative. Decompressed left colon. Negative transverse colon, right colon, appendix, small bowel, stomach, duodenum, spleen, pancreas, adrenal glands, and kidneys. No abdominal free fluid. Intra and extrahepatic biliary ductal dilatation. The CBD measures up to 11 mm diameter and tapers to the junction with the duodenum. No discrete filling defect evident by CT. No pericholecystic inflammation is evident by CT. No main pancreatic ductal dilatation. No discrete liver lesion. Portal venous system is patent. Major arterial structures are patent. No lymphadenopathy. IMPRESSION: 1. Biliary obstruction with intra and extrahepatic biliary ductal dilatation re- demonstrated. The CBD tapers to the duodenum. No distinct filling defect by CT. See also right upper quadrant ultrasound from 07/12/2015. 2. No other acute or inflammatory process in the abdomen or pelvis. Electronically Signed   By: Odessa Fleming M.D.   On: 07/13/2015 01:49   Dg Ercp Biliary & Pancreatic Ducts  07/13/2015  CLINICAL DATA:  ERCP with sphincterotomy, balloon sweeps performed, stone removed. Evaluate patency of CBD. 8 min 11  sec fl time. Dr. Marina GoodellPerry requests radiologist to page him after interpretation of images [phone]. EXAM: ERCP TECHNIQUE: Multiple spot images obtained with the fluoroscopic device and submitted for interpretation post-procedure. FLUOROSCOPY TIME:  8 min 11 sec COMPARISON:  CT 07/13/2015 FINDINGS: A series of fluoroscopic spot images document endoscopic  cannulation and opacification of the CBD. The CBD is distended. Intrahepatic bile ducts are incompletely opacified, appearing mildly dilated centrally. Cystic duct is patent with partial opacification of the gallbladder lumen. Subsequent images document passage of a balloon catheter through the CBD. Filling defect is noted in the distal CBD on an intermediate image. On final images there is decompression of the biliary tree and CBD. IMPRESSION: 1. Mild intra and extrahepatic biliary ductal dilatation, with endoscopic intervention. These images were submitted for radiologic interpretation only. Please see the procedural report for the amount of contrast and the fluoroscopy time utilized. Electronically Signed   By: Corlis Leak  Hassell M.D.   On: 07/13/2015 15:08   [place] Abdomen Limited Ruq  07/13/2015  CLINICAL DATA:  24 year old female with right upper quadrant abdominal pain, vomiting for 2 days and transaminitis. EXAM: US ABDOMEN LIMITED - RIGHT UPPER QUADRANT COMPARISON:  None. FINDINGS: Gallbladder: Multiple mobile gallstones are identified, measuring up to 8 mm. Mild gallbladder wall thickening is present. There is no evidence of pericholecystic fluid or sonographic Murphy's sign. Common bile duct: Diameter: 9 mm.  Intrahepatic dilatation is identified. Liver: No focal lesions identified. There is no evidence of free fluid within the right upper abdomen. IMPRESSION: Cholelithiasis and mild gallbladder wall thickening which may represent acute cholecystitis. Intrahepatic and extrahepatic biliary dilatation - likely distal obstructing cause/choledocholithiasis. Electronically Signed   By: Harmon PierJeffrey  Hu M.D.   On: 07/13/2015 00:04   Assessment / Plan: -Cholelithiasis/choledocholithiasis:  S/p ERCP 10/12 with sphincterotomy and stone extraction.  Doing well this AM.  LFT's trending down and feels well.  For cholecystectomy this afternoon.  Will sign off from GI standpoint.  Call with questions.   LOS: 1 day   Gabriela Giannelli,  Abdulla Pooley D.  07/14/2015, 10:57 AM  Pager number [phone]

## 2015-07-15 LAB — COMPREHENSIVE METABOLIC PANEL
ALT: 228 U/L — AB (ref 14–54)
AST: 92 U/L — ABNORMAL HIGH (ref 15–41)
Albumin: 3.3 g/dL — ABNORMAL LOW (ref 3.5–5.0)
Alkaline Phosphatase: 281 U/L — ABNORMAL HIGH (ref 38–126)
Anion gap: 8 (ref 5–15)
BUN: <5 mg/dL — ABNORMAL LOW (ref 6–20)
CHLORIDE: 106 mmol/L (ref 101–111)
CO2: 25 mmol/L (ref 22–32)
CREATININE: 0.47 mg/dL (ref 0.44–1.00)
Calcium: 8.9 mg/dL (ref 8.9–10.3)
Glucose, Bld: 102 mg/dL — ABNORMAL HIGH (ref 65–99)
Potassium: 3.7 mmol/L (ref 3.5–5.1)
Sodium: 139 mmol/L (ref 135–145)
TOTAL PROTEIN: 6.2 g/dL — AB (ref 6.5–8.1)
Total Bilirubin: 1.9 mg/dL — ABNORMAL HIGH (ref 0.3–1.2)

## 2015-07-15 MED ORDER — TRAMADOL HCL 50 MG PO TABS
100.0000 mg | ORAL_TABLET | Freq: Four times a day (QID) | ORAL | Status: DC | PRN
  Administered 2015-07-15: 100 mg via ORAL
  Filled 2015-07-15: qty 2

## 2015-07-15 MED ORDER — TRAMADOL HCL 50 MG PO TABS: 100.0000 mg | ORAL_TABLET | Freq: Four times a day (QID) | ORAL | Status: AC | PRN

## 2015-07-15 MED ORDER — IBUPROFEN 200 MG PO TABS
600.0000 mg | ORAL_TABLET | Freq: Four times a day (QID) | ORAL | Status: DC | PRN
Start: 2015-07-15 — End: 2015-07-15

## 2015-07-15 MED ORDER — OXYCODONE-ACETAMINOPHEN 5-325 MG PO TABS: 1.0000 | ORAL_TABLET | ORAL | Status: AC | PRN

## 2015-07-15 MED ORDER — OXYCODONE-ACETAMINOPHEN 5-325 MG PO TABS
1.0000 | ORAL_TABLET | ORAL | Status: DC | PRN
  Administered 2015-07-15: 1 via ORAL
  Filled 2015-07-15: qty 1

## 2015-07-15 NOTE — Progress Notes (Signed)
Patient d/c instructions and prescriptions given, teach back utilized, verbalized understanding. All questions answered appropriately. Patient tolerated her diet and pain med.

## 2015-07-15 NOTE — Progress Notes (Signed)
1 Day Post-Op  Subjective: She is doing well post op, had breakfast without any issues.  She is sore, sites all look good.  Objective: Vital signs in last 24 hours: Temp:  [98 F (36.7 C)-98.7 F (37.1 C)] 98.5 F (36.9 C) (10/14 0545) Pulse Rate:  [63-107] 63 (10/14 0545) Resp:  [15-20] 16 (10/14 0545) BP: (116-135)/(56-85) 118/56 mmHg (10/14 0545) SpO2:  [96 %-100 %] 98 % (10/14 0545) Last BM Date: 07/14/15 PO 600 Regular diet Afebrile, VSS Bilirubin down to 1.9 Intake/Output from previous day: 10/13 0701 - 10/14 0700 In: 900 [P.O.:600; I.V.:300] Out: 2300 [Urine:2300] Intake/Output this shift:    General appearance: alert, cooperative and no distress GI: soft, sore, sites all look fine.  dressing removed and steri strips mostly intact.  Lab Results:   Recent Labs  07/13/15 0720 07/14/15 0355  WBC 7.9 8.3  HGB 12.7 12.1  HCT 38.3 35.9*  PLT 310 324    BMET  Recent Labs  07/14/15 0355 07/15/15 0405  NA 132* 139  K 3.7 3.7  CL 104 106  CO2 19* 25  GLUCOSE 80 102*  BUN <5* <5*  CREATININE 0.50 0.47  CALCIUM 8.4* 8.9   PT/INR  Recent Labs  07/13/15 0720  LABPROT 13.8  INR 1.04     Recent Labs Lab 07/12/15 2201 07/13/15 0720 07/14/15 0355 07/15/15 0405  AST 158* 177* 95* 92*  ALT 273* 306* 239* 228*  ALKPHOS 292* 358* 318* 281*  BILITOT 5.3* 6.5* 3.1* 1.9*  PROT 5.9* 6.9 6.2* 6.2*  ALBUMIN 3.2* 3.7 3.2* 3.3*     Lipase     Component Value Date/Time   LIPASE 39 07/14/2015 0355     Studies/Results: Dg Cholangiogram Operative  07/14/2015  CLINICAL DATA:  Gallstones EXAM: INTRAOPERATIVE CHOLANGIOGRAM TECHNIQUE: Cholangiographic images from the C-arm fluoroscopic device were submitted for interpretation post-operatively. Please see the procedural report for the amount of contrast and the fluoroscopy time utilized. COMPARISON:  None. FINDINGS: Contrast fills the biliary tree and duodenum without filling defects in a common bile ducts.  IMPRESSION: Patent biliary tree. Electronically Signed   By: Jolaine Click M.D.   On: 07/14/2015 14:00   Dg Ercp Biliary & Pancreatic Ducts  07/13/2015  CLINICAL DATA:  ERCP with sphincterotomy, balloon sweeps performed, stone removed. Evaluate patency of CBD. 8 min 11 sec fl time. Dr. Marina Goodell requests radiologist to page him after interpretation of images [phone]. EXAM: ERCP TECHNIQUE: Multiple spot images obtained with the fluoroscopic device and submitted for interpretation post-procedure. FLUOROSCOPY TIME:  8 min 11 sec COMPARISON:  CT 07/13/2015 FINDINGS: A series of fluoroscopic spot images document endoscopic cannulation and opacification of the CBD. The CBD is distended. Intrahepatic bile ducts are incompletely opacified, appearing mildly dilated centrally. Cystic duct is patent with partial opacification of the gallbladder lumen. Subsequent images document passage of a balloon catheter through the CBD. Filling defect is noted in the distal CBD on an intermediate image. On final images there is decompression of the biliary tree and CBD. IMPRESSION: 1. Mild intra and extrahepatic biliary ductal dilatation, with endoscopic intervention. These images were submitted for radiologic interpretation only. Please see the procedural report for the amount of contrast and the fluoroscopy time utilized. Electronically Signed   By: Corlis Leak M.D.   On: 07/13/2015 15:08    Medications: . famotidine (PEPCID) IV  20 mg Intravenous Q24H    Assessment/Plan Chronic cholecystitis, cholelithiasis, choledocholithiasis ERCP with sphincterotomy/papillotomy, ERCP with removal of calculus/calculi, 07/13/15, Dr. Jonny Ruiz  [place] Laparoscopic cholecystectomy with intra-operative cholangiography, 07/14/15, Dr. Darnell Levelodd Gerkin Hx of anxiety/depression/OCD Antibiotics:  None DVT:  SCD's   plan:  From our standpoint she can go home when she can eat, drink, voiding, and tolerating pain with PO pain meds.  She has instructions and  follow up instructions in AVS.   LOS: 2 days    Keinan Brouillet 07/15/2015

## 2015-07-15 NOTE — Progress Notes (Signed)
Abdominal ports cleansed per order,streri strips applied,sites looks good,no active bleeding. Patient ambulated this am. Will continue to monitor.

## 2015-07-15 NOTE — Progress Notes (Signed)
Patient d/c home,stable. 

## 2015-07-15 NOTE — Discharge Instructions (Signed)
Laparoscopic Cholecystectomy, Care After °Refer to this sheet in the next few weeks. These instructions provide you with information about caring for yourself after your procedure. Your health care provider may also give you more specific instructions. Your treatment has been planned according to current medical practices, but problems sometimes occur. Call your health care provider if you have any problems or questions after your procedure. °WHAT TO EXPECT AFTER THE PROCEDURE °After your procedure, it is common to have: °· Pain at your incision sites. You will be given pain medicines to control your pain. °· Mild nausea or vomiting. This should improve after the first 24 hours. °· Bloating and possible shoulder pain from the gas that was used during the procedure. This will improve after the first 24 hours. °HOME CARE INSTRUCTIONS °Incision Care °· Follow instructions from your health care provider about how to take care of your incisions. Make sure you: °¨ Wash your hands with soap and water before you change your bandage (dressing). If soap and water are not available, use hand sanitizer. °¨ Change your dressing as told by your health care provider. °¨ Leave stitches (sutures), skin glue, or adhesive strips in place. These skin closures may need to be in place for 2 weeks or longer. If adhesive strip edges start to loosen and curl up, you may trim the loose edges. Do not remove adhesive strips completely unless your health care provider tells you to do that. °· Do not take baths, swim, or use a hot tub until your health care provider approves. Ask your health care provider if you can take showers. You may only be allowed to take sponge baths for bathing. °General Instructions °· Take over-the-counter and prescription medicines only as told by your health care provider. °· Do not drive or operate heavy machinery while taking prescription pain medicine. °· Return to your normal diet as told by your health care  provider. °· Do not lift anything that is heavier than 10 lb (4.5 kg). °· Do not play contact sports for one week or until your health care provider approves. °SEEK MEDICAL CARE IF:  °· You have redness, swelling, or pain at the site of your incision. °· You have fluid, blood, or pus coming from your incision. °· You notice a bad smell coming from your incision area. °· Your surgical incisions break open. °· You have a fever. °SEEK IMMEDIATE MEDICAL CARE IF: °· You develop a rash. °· You have difficulty breathing. °· You have chest pain. °· You have increasing pain in your shoulders (shoulder strap areas). °· You faint or have dizzy episodes while you are standing. °· You have severe pain in your abdomen. °· You have nausea or vomiting that lasts for more than one day. °  °This information is not intended to replace advice given to you by your health care provider. Make sure you discuss any questions you have with your health care provider. °  °Document Released: 09/17/2005 Document Revised: 06/08/2015 Document Reviewed: 04/29/2013 °Elsevier Interactive Patient Education ©2016 [employer] °CCS ______[facility] °LAPAROSCOPIC SURGERY: POST OP INSTRUCTIONS °Always review your discharge instruction sheet given to you by the facility where your surgery was performed. °IF YOU HAVE DISABILITY OR FAMILY LEAVE FORMS, YOU MUST BRING THEM TO THE OFFICE FOR PROCESSING.   °DO NOT GIVE THEM TO YOUR DOCTOR. ° °1. A prescription for pain medication may be given to you upon discharge.  Take your pain medication as prescribed, if needed.  If narcotic   pain medicine is not needed, then you may take acetaminophen (Tylenol) or ibuprofen (Advil) as needed. °2. Take your usually prescribed medications unless otherwise directed. °3. If you need a refill on your pain medication, please contact your pharmacy.  They will contact our office to request authorization. Prescriptions will not be filled after 5pm or on  week-ends. °4. You should follow a light diet the first few days after arrival home, such as soup and crackers, etc.  Be sure to include lots of fluids daily. °5. Most patients will experience some swelling and bruising in the area of the incisions.  Ice packs will help.  Swelling and bruising can take several days to resolve.  °6. It is common to experience some constipation if taking pain medication after surgery.  Increasing fluid intake and taking a stool softener (such as Colace) will usually help or prevent this problem from occurring.  A mild laxative (Milk of Magnesia or Miralax) should be taken according to package instructions if there are no bowel movements after 48 hours. °7. Unless discharge instructions indicate otherwise, you may remove your bandages 24-48 hours after surgery, and you may shower at that time.  You may have steri-strips (small skin tapes) in place directly over the incision.  These strips should be left on the skin for 7-10 days.  If your surgeon used skin glue on the incision, you may shower in 24 hours.  The glue will flake off over the next 2-3 weeks.  Any sutures or staples will be removed at the office during your follow-up visit. °8. ACTIVITIES:  You may resume regular (light) daily activities beginning the next day--such as daily self-care, walking, climbing stairs--gradually increasing activities as tolerated.  You may have sexual intercourse when it is comfortable.  Refrain from any heavy lifting or straining until approved by your doctor. °a. You may drive when you are no longer taking prescription pain medication, you can comfortably wear a seatbelt, and you can safely maneuver your car and apply brakes. °b. RETURN TO WORK:  __________________________________________________________ °9. You should see your doctor in the office for a follow-up appointment approximately 2-3 weeks after your surgery.  Make sure that you call for this appointment within a day or two after you  arrive home to insure a convenient appointment time. °10. OTHER INSTRUCTIONS: __________________________________________________________________________________________________________________________ __________________________________________________________________________________________________________________________ °WHEN TO CALL YOUR DOCTOR: °1. Fever over 101.0 °2. Inability to urinate °3. Continued bleeding from incision. °4. Increased pain, redness, or drainage from the incision. °5. Increasing abdominal pain ° °The clinic staff is available to answer your questions during regular business hours.  Please don’t hesitate to call and ask to speak to one of the nurses for clinical concerns.  If you have a medical emergency, go to the nearest emergency room or call 911.  A surgeon from [facility] is always on call at the hospital. °[address], [address], [place], [place]  [address] ? [address] [address], [place],    [address] °[phone] ? [phone] ? FAX [phone] °Web site: [email or web address] ° °

## 2015-07-16 LAB — HEMOGLOBIN A1C
Hgb A1c MFr Bld: 5.3 % (ref 4.8–5.6)
MEAN PLASMA GLUCOSE: 105 mg/dL

## 2015-07-17 NOTE — Discharge Summary (Signed)
Physician Discharge Summary  Katherine Morrow ZOX:[id number] DOB: 05/09/1991 DOA: 07/12/2015  PCP: No primary care provider on file.  Admit date: 07/12/2015 Discharge date: 07/17/2015  Time spent: 25 minutes  Recommendations for Outpatient Follow-up:  1. Follow up with surgery as recommended.   Discharge Diagnoses:  Principal Problem:   Biliary obstruction Active Problems:   Cholecystitis   Choledocholithiasis   Chronic cholecystitis with calculus   Discharge Condition: improved.   Diet recommendation: regular  Filed Weights   07/13/15 0548 07/14/15 0527  Weight: 87.1 kg (192 lb 0.3 oz) 85.276 kg (188 lb)    History of present illness:  Katherine Morrow is a 24 y.o. female with Past medical history of anxiety presents with complains of right-sided pain  Hospital Course:  1. right quadrant abdominal pain/ acute cholecystitis, choledocholithiasis, biliary obstruction - s/p ERCP WITH sphincterotomy and stone extraction ON 10/12 - am labs reveal improving liver function tests.  - underwent lap chole with intra operative cholangiography.on 10/13  - hydration with IV fluids, pain control and anti emetics while inpatient.   - stable to be discharged home and follow up with surgery as recommended.   Procedures: ERCP by GI 10/12  10/13 Lap chole Surgery with intra-operative cholangiography   Consultations: Surgery.  GI  Discharge Exam: Filed Vitals:   07/15/15 1451  BP: 127/68  Pulse: 92  Temp: 98.4 F (36.9 C)  Resp: 18    General: alert afebrile comfortable Cardiovascular: s1s2 Respiratory: ctab  Discharge Instructions   Discharge Instructions    Diet general    Complete by:  As directed      Discharge instructions    Complete by:  As directed   Follow up with surgery as recommended.  Please check liver function panel in 2 to 4 weeks either at PCP office or surgeon's office.          Discharge Medication List as of 07/15/2015  3:01 PM    START taking  these medications   Details  oxyCODONE-acetaminophen (PERCOCET/ROXICET) 5-325 MG tablet Take 1-2 tablets by mouth every 4 (four) hours as needed for moderate pain., Starting 07/15/2015, Until Discontinued, Print    traMADol (ULTRAM) 50 MG tablet Take 2 tablets (100 mg total) by mouth every 6 (six) hours as needed for moderate pain., Starting 07/15/2015, Until Discontinued, Print      STOP taking these medications     ibuprofen (ADVIL,MOTRIN) 200 MG tablet      OVER THE COUNTER MEDICATION        Allergies  Allergen Reactions  . Sulfa Antibiotics Other (See Comments)    Patient doesn't know   Follow-up Information    Follow up with [facility] On 07/27/2015.   Specialty:  General Surgery   Why:  Your appointment is at 12:00 PM, be there at 11:30 AM for check in.   Contact information:   [address] [place] [address] [phone]        The results of significant diagnostics from this hospitalization (including imaging, microbiology, ancillary and laboratory) are listed below for reference.    Significant Diagnostic Studies: Dg Chest 2 View  07/12/2015  CLINICAL DATA:  Chest wall pain EXAM: CHEST  2 VIEW COMPARISON:  None. FINDINGS: The heart size and mediastinal contours are within normal limits. Both lungs are clear. The visualized skeletal structures are unremarkable. IMPRESSION: No active cardiopulmonary disease. Electronically Signed   By: Natasha MeadLiviu  Pop M.D.   On: 07/12/2015 18:49   Dg  Cholangiogram Operative  07/14/2015  CLINICAL DATA:  Gallstones EXAM: INTRAOPERATIVE CHOLANGIOGRAM TECHNIQUE: Cholangiographic images from the C-arm fluoroscopic device were submitted for interpretation post-operatively. Please see the procedural report for the amount of contrast and the fluoroscopy time utilized. COMPARISON:  None. FINDINGS: Contrast fills the biliary tree and duodenum without filling defects in a common bile ducts. IMPRESSION: Patent biliary tree.  Electronically Signed   By: Jolaine Click M.D.   On: 07/14/2015 14:00   Ct Abdomen Pelvis W Contrast  07/13/2015  CLINICAL DATA:  24 year old female with acute abdomen and back pain. Symptoms for 5 days. EXAM: CT abdomen and pelvis with contrast CONTRAST:  100 mL Omnipaque 300 COMPARISON:  Right upper quadrant ultrasound 07/12/2015 FINDINGS: Negative lung bases.  No pericardial or pleural effusion. No osseous abnormality identified. No pelvic free fluid. Negative uterus, at adnexa and distal colon. Distended but otherwise unremarkable urinary bladder. Redundant sigmoid colon otherwise is negative. Decompressed left colon. Negative transverse colon, right colon, appendix, small bowel, stomach, duodenum, spleen, pancreas, adrenal glands, and kidneys. No abdominal free fluid. Intra and extrahepatic biliary ductal dilatation. The CBD measures up to 11 mm diameter and tapers to the junction with the duodenum. No discrete filling defect evident by CT. No pericholecystic inflammation is evident by CT. No main pancreatic ductal dilatation. No discrete liver lesion. Portal venous system is patent. Major arterial structures are patent. No lymphadenopathy. IMPRESSION: 1. Biliary obstruction with intra and extrahepatic biliary ductal dilatation re- demonstrated. The CBD tapers to the duodenum. No distinct filling defect by CT. See also right upper quadrant ultrasound from 07/12/2015. 2. No other acute or inflammatory process in the abdomen or pelvis. Electronically Signed   By: Odessa Fleming M.D.   On: 07/13/2015 01:49   Dg Ercp Biliary & Pancreatic Ducts  07/13/2015  CLINICAL DATA:  ERCP with sphincterotomy, balloon sweeps performed, stone removed. Evaluate patency of CBD. 8 min 11 sec fl time. Dr. Marina Goodell requests radiologist to page him after interpretation of images [phone]. EXAM: ERCP TECHNIQUE: Multiple spot images obtained with the fluoroscopic device and submitted for interpretation post-procedure. FLUOROSCOPY TIME:   8 min 11 sec COMPARISON:  CT 07/13/2015 FINDINGS: A series of fluoroscopic spot images document endoscopic cannulation and opacification of the CBD. The CBD is distended. Intrahepatic bile ducts are incompletely opacified, appearing mildly dilated centrally. Cystic duct is patent with partial opacification of the gallbladder lumen. Subsequent images document passage of a balloon catheter through the CBD. Filling defect is noted in the distal CBD on an intermediate image. On final images there is decompression of the biliary tree and CBD. IMPRESSION: 1. Mild intra and extrahepatic biliary ductal dilatation, with endoscopic intervention. These images were submitted for radiologic interpretation only. Please see the procedural report for the amount of contrast and the fluoroscopy time utilized. Electronically Signed   By: Corlis Leak M.D.   On: 07/13/2015 15:08   US Abdomen Limited Ruq  07/13/2015  CLINICAL DATA:  24 year old female with right upper quadrant abdominal pain, vomiting for 2 days and transaminitis. EXAM: US ABDOMEN LIMITED - RIGHT UPPER QUADRANT COMPARISON:  None. FINDINGS: Gallbladder: Multiple mobile gallstones are identified, measuring up to 8 mm. Mild gallbladder wall thickening is present. There is no evidence of pericholecystic fluid or sonographic Murphy's sign. Common bile duct: Diameter: 9 mm.  Intrahepatic dilatation is identified. Liver: No focal lesions identified. There is no evidence of free fluid within the right upper abdomen. IMPRESSION: Cholelithiasis and mild gallbladder wall thickening which may represent acute  cholecystitis. Intrahepatic and extrahepatic biliary dilatation - likely distal obstructing cause/choledocholithiasis. Electronically Signed   By: Harmon Pier M.D.   On: 07/13/2015 00:04    Microbiology: Recent Results (from the past 240 hour(s))  Urine culture     Status: None   Collection Time: 07/12/15 10:45 PM  Result Value Ref Range Status   Specimen Description  URINE, RANDOM  Final   Special Requests NONE  Final   Culture   Final    NO GROWTH 1 DAY Performed at [facility]    Report Status 07/14/2015 FINAL  Final     Labs: Basic Metabolic Panel:  Recent Labs Lab 07/12/15 2201 07/13/15 0720 07/14/15 0355 07/15/15 0405  NA 134* 136 132* 139  K 4.3 3.2* 3.7 3.7  CL 111 107 104 106  CO2 12* 15* 19* 25  GLUCOSE 74 80 80 102*  BUN <5* 6 <5* <5*  CREATININE <0.30* 0.38* 0.50 0.47  CALCIUM 8.1* 8.8* 8.4* 8.9   Liver Function Tests:  Recent Labs Lab 07/12/15 2201 07/13/15 0720 07/14/15 0355 07/15/15 0405  AST 158* 177* 95* 92*  ALT 273* 306* 239* 228*  ALKPHOS 292* 358* 318* 281*  BILITOT 5.3* 6.5* 3.1* 1.9*  PROT 5.9* 6.9 6.2* 6.2*  ALBUMIN 3.2* 3.7 3.2* 3.3*    Recent Labs Lab 07/12/15 2201 07/14/15 0355  LIPASE 19* 39   No results for input(s): AMMONIA in the last 168 hours. CBC:  Recent Labs Lab 07/12/15 2201 07/13/15 0720 07/14/15 0355  WBC 10.0 7.9 8.3  NEUTROABS  --  4.8  --   HGB 11.8* 12.7 12.1  HCT 35.3* 38.3 35.9*  MCV 90.5 91.2 90.4  PLT 307 310 324   Cardiac Enzymes: No results for input(s): CKTOTAL, CKMB, CKMBINDEX, TROPONINI in the last 168 hours. BNP: BNP (last 3 results) No results for input(s): BNP in the last 8760 hours.  ProBNP (last 3 results) No results for input(s): PROBNP in the last 8760 hours.  CBG: No results for input(s): GLUCAP in the last 168 hours.     SignedKathlen Mody  [facility] 07/17/2015, 6:27 PM

## 2016-03-31 IMAGING — RF DG CHOLANGIOGRAM OPERATIVE
1 series · 4 of 4 positions shown · non-contrast
Comparison: None.

CLINICAL DATA: Gallstones

EXAM:
INTRAOPERATIVE CHOLANGIOGRAM
TECHNIQUE: Cholangiographic images from the C-arm fluoroscopic device were
submitted for interpretation post-operatively. Please see the
procedural report for the amount of contrast and the fluoroscopy
time utilized.

[Series 1: run · 4 of 58 frames shown]
[frame 2/58]
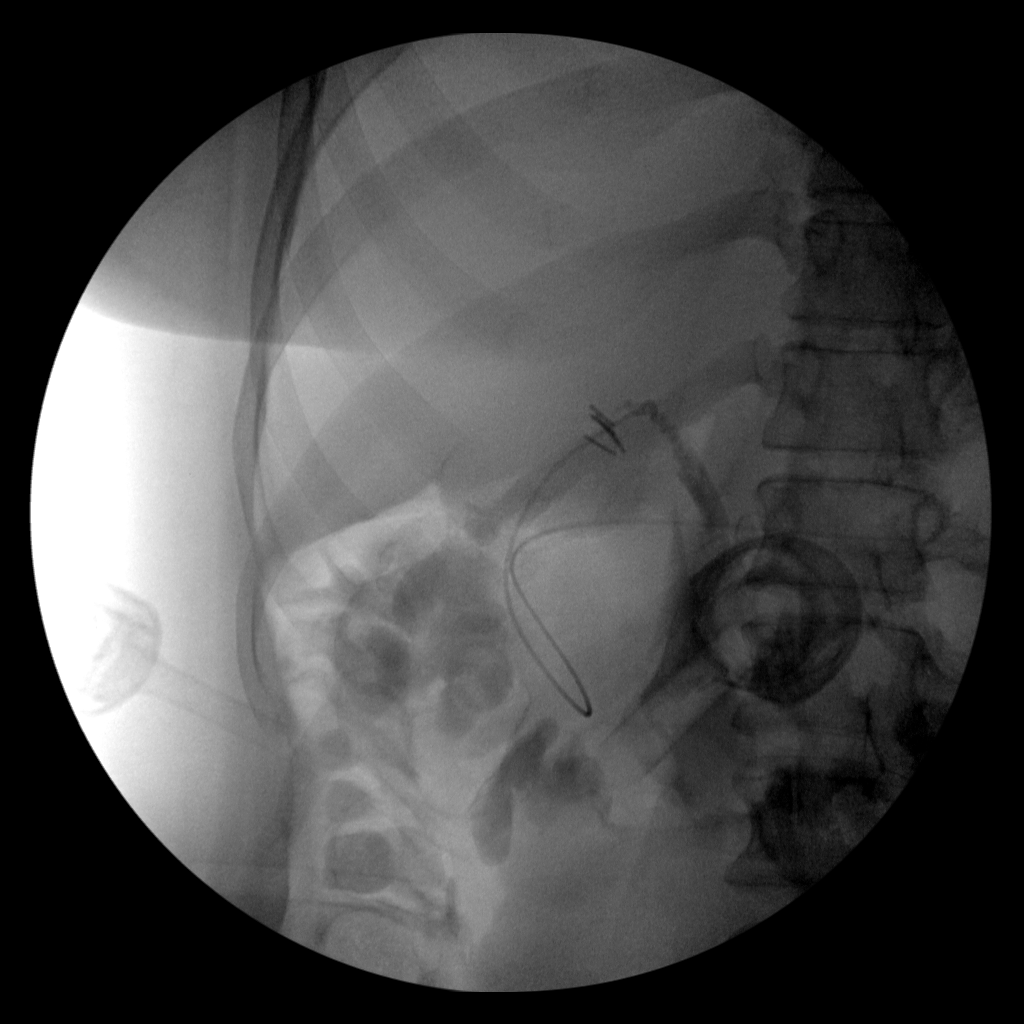
[frame 9/58]
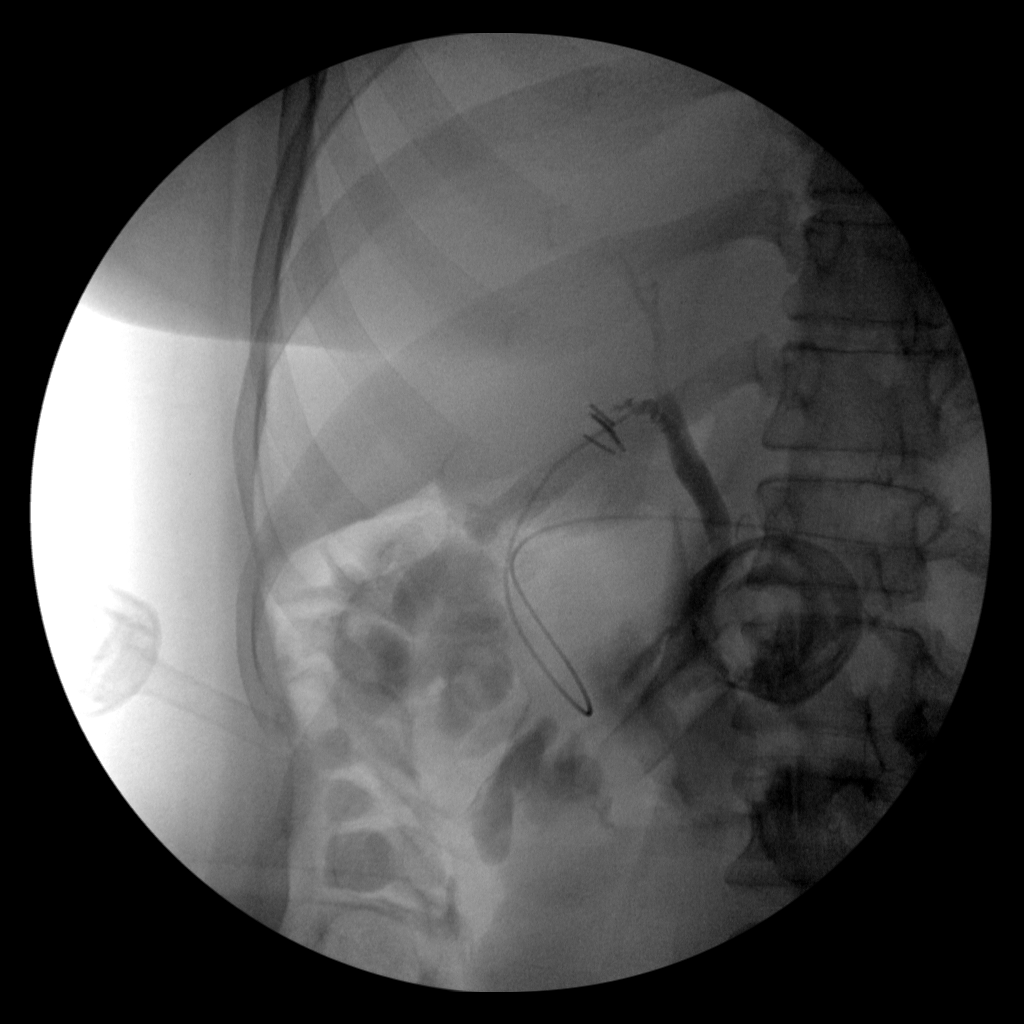
[frame 30/58]
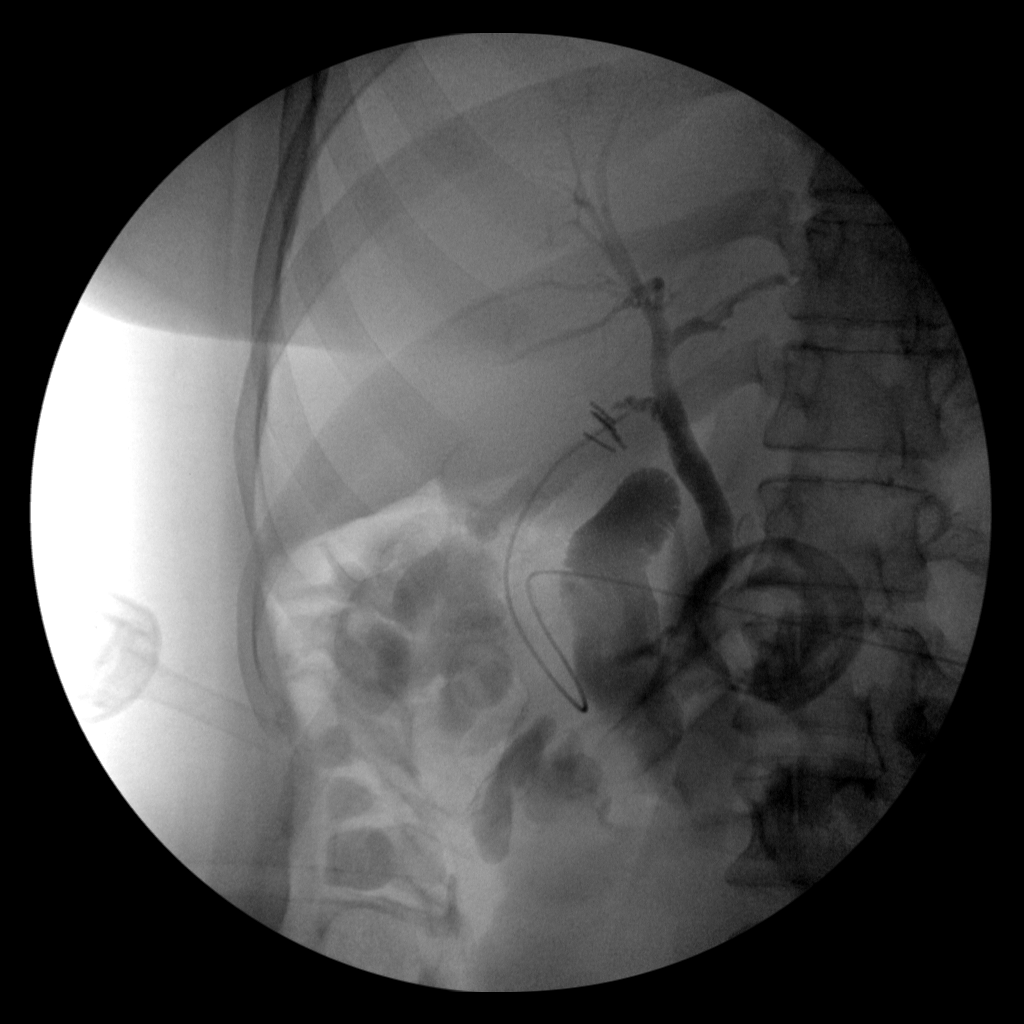
[frame 50/58]
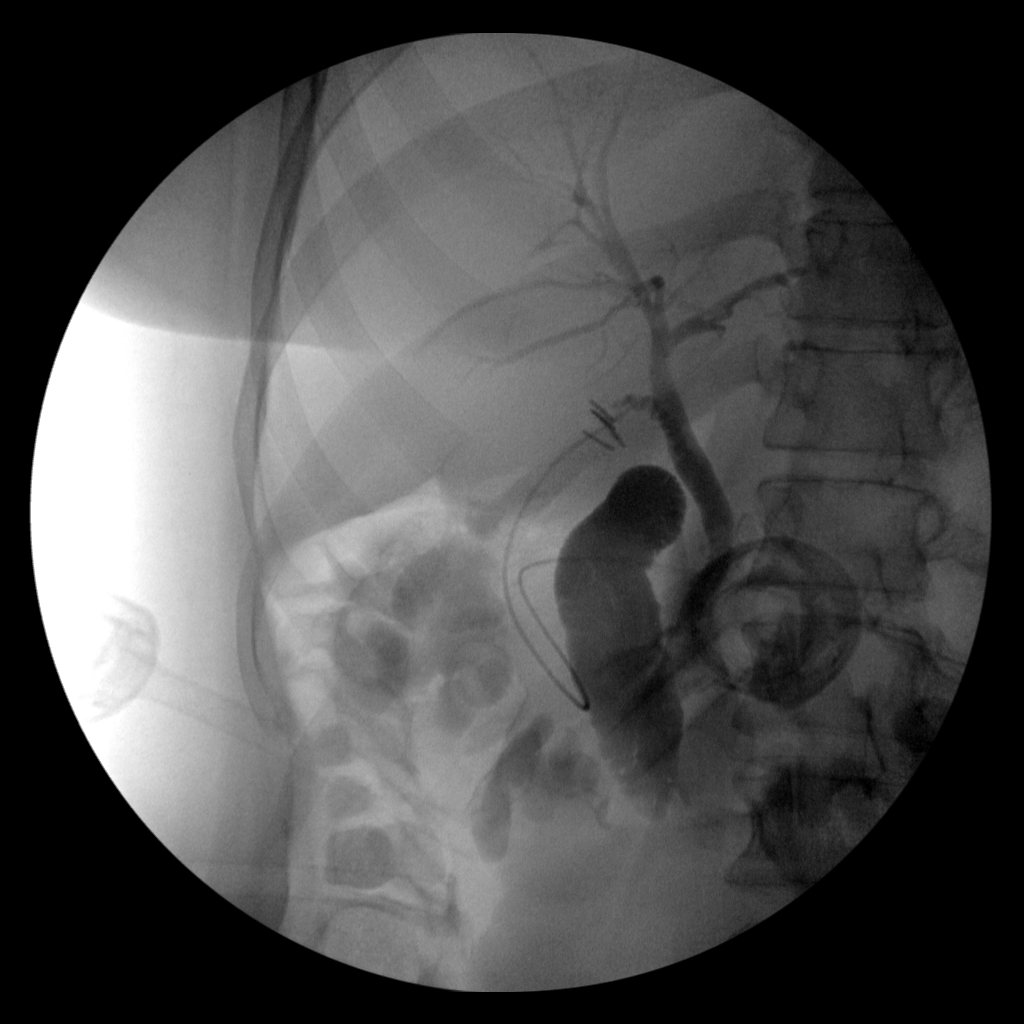

[4 of 4 positions shown; findings below may reference images not displayed]

FINDINGS: Contrast fills the biliary tree and duodenum without filling defects
in a common bile ducts.
IMPRESSION: Patent biliary tree.
